# Patient Record
Sex: Male | Born: 1955
Health system: Southern US, Community
[De-identification: ages and names within clinical notes are randomized; demographics above are authoritative.]

## PROBLEM LIST (undated history)

## (undated) DIAGNOSIS — Z9289 Personal history of other medical treatment: Secondary | ICD-10-CM

## (undated) DIAGNOSIS — I10 Essential (primary) hypertension: Secondary | ICD-10-CM

## (undated) DIAGNOSIS — I480 Paroxysmal atrial fibrillation: Secondary | ICD-10-CM

## (undated) HISTORY — PX: HERNIA REPAIR: SHX51

---

## 2008-11-08 ENCOUNTER — Ambulatory Visit: Payer: Self-pay | Admitting: Family Medicine

## 2009-09-28 ENCOUNTER — Ambulatory Visit: Payer: Self-pay | Admitting: Internal Medicine

## 2010-10-28 HISTORY — PX: OTHER SURGICAL HISTORY: SHX169

## 2017-12-24 DIAGNOSIS — K4020 Bilateral inguinal hernia, without obstruction or gangrene, not specified as recurrent: Secondary | ICD-10-CM | POA: Diagnosis not present

## 2017-12-24 DIAGNOSIS — I10 Essential (primary) hypertension: Secondary | ICD-10-CM | POA: Diagnosis not present

## 2017-12-24 DIAGNOSIS — K4090 Unilateral inguinal hernia, without obstruction or gangrene, not specified as recurrent: Secondary | ICD-10-CM | POA: Diagnosis not present

## 2017-12-24 DIAGNOSIS — Z7982 Long term (current) use of aspirin: Secondary | ICD-10-CM | POA: Diagnosis not present

## 2017-12-24 DIAGNOSIS — Z79899 Other long term (current) drug therapy: Secondary | ICD-10-CM | POA: Diagnosis not present

## 2017-12-24 DIAGNOSIS — K219 Gastro-esophageal reflux disease without esophagitis: Secondary | ICD-10-CM | POA: Diagnosis not present

## 2017-12-24 DIAGNOSIS — D176 Benign lipomatous neoplasm of spermatic cord: Secondary | ICD-10-CM | POA: Diagnosis not present

## 2017-12-24 DIAGNOSIS — E876 Hypokalemia: Secondary | ICD-10-CM | POA: Diagnosis not present

## 2017-12-24 DIAGNOSIS — M19072 Primary osteoarthritis, left ankle and foot: Secondary | ICD-10-CM | POA: Diagnosis not present

## 2018-01-15 DIAGNOSIS — Z8719 Personal history of other diseases of the digestive system: Secondary | ICD-10-CM | POA: Diagnosis not present

## 2018-01-15 DIAGNOSIS — Z48815 Encounter for surgical aftercare following surgery on the digestive system: Secondary | ICD-10-CM | POA: Diagnosis not present

## 2018-01-15 DIAGNOSIS — K5900 Constipation, unspecified: Secondary | ICD-10-CM | POA: Diagnosis not present

## 2018-01-15 DIAGNOSIS — Z79899 Other long term (current) drug therapy: Secondary | ICD-10-CM | POA: Diagnosis not present

## 2018-05-21 DIAGNOSIS — L821 Other seborrheic keratosis: Secondary | ICD-10-CM | POA: Diagnosis not present

## 2018-05-21 DIAGNOSIS — D225 Melanocytic nevi of trunk: Secondary | ICD-10-CM | POA: Diagnosis not present

## 2018-05-21 DIAGNOSIS — L570 Actinic keratosis: Secondary | ICD-10-CM | POA: Diagnosis not present

## 2018-08-05 ENCOUNTER — Encounter: Payer: Self-pay | Admitting: Emergency Medicine

## 2018-08-05 ENCOUNTER — Other Ambulatory Visit: Payer: Self-pay

## 2018-08-05 ENCOUNTER — Emergency Department: Payer: BLUE CROSS/BLUE SHIELD

## 2018-08-05 ENCOUNTER — Observation Stay
Admission: EM | Admit: 2018-08-05 | Discharge: 2018-08-06 | Disposition: A | Discharge status: Home / Self Care | Payer: BLUE CROSS/BLUE SHIELD | Attending: Internal Medicine | Admitting: Internal Medicine

## 2018-08-05 ENCOUNTER — Observation Stay (HOSPITAL_BASED_OUTPATIENT_CLINIC_OR_DEPARTMENT_OTHER)
Admit: 2018-08-05 | Discharge: 2018-08-05 | Disposition: A | Discharge status: Home / Self Care | Payer: BLUE CROSS/BLUE SHIELD | Attending: Internal Medicine | Admitting: Internal Medicine

## 2018-08-05 DIAGNOSIS — Z79899 Other long term (current) drug therapy: Secondary | ICD-10-CM | POA: Insufficient documentation

## 2018-08-05 DIAGNOSIS — I480 Paroxysmal atrial fibrillation: Secondary | ICD-10-CM | POA: Diagnosis not present

## 2018-08-05 DIAGNOSIS — Z885 Allergy status to narcotic agent status: Secondary | ICD-10-CM | POA: Insufficient documentation

## 2018-08-05 DIAGNOSIS — R000 Tachycardia, unspecified: Secondary | ICD-10-CM | POA: Diagnosis not present

## 2018-08-05 DIAGNOSIS — E785 Hyperlipidemia, unspecified: Secondary | ICD-10-CM | POA: Diagnosis not present

## 2018-08-05 DIAGNOSIS — Z7982 Long term (current) use of aspirin: Secondary | ICD-10-CM | POA: Insufficient documentation

## 2018-08-05 DIAGNOSIS — R55 Syncope and collapse: Secondary | ICD-10-CM | POA: Diagnosis not present

## 2018-08-05 DIAGNOSIS — I499 Cardiac arrhythmia, unspecified: Secondary | ICD-10-CM | POA: Diagnosis present

## 2018-08-05 DIAGNOSIS — I4891 Unspecified atrial fibrillation: Secondary | ICD-10-CM | POA: Diagnosis not present

## 2018-08-05 DIAGNOSIS — R079 Chest pain, unspecified: Secondary | ICD-10-CM | POA: Diagnosis not present

## 2018-08-05 DIAGNOSIS — R9431 Abnormal electrocardiogram [ECG] [EKG]: Secondary | ICD-10-CM | POA: Diagnosis not present

## 2018-08-05 DIAGNOSIS — I10 Essential (primary) hypertension: Secondary | ICD-10-CM | POA: Insufficient documentation

## 2018-08-05 DIAGNOSIS — I4821 Permanent atrial fibrillation: Secondary | ICD-10-CM | POA: Diagnosis not present

## 2018-08-05 DIAGNOSIS — R739 Hyperglycemia, unspecified: Secondary | ICD-10-CM | POA: Insufficient documentation

## 2018-08-05 DIAGNOSIS — Z7901 Long term (current) use of anticoagulants: Secondary | ICD-10-CM | POA: Diagnosis not present

## 2018-08-05 HISTORY — DX: Personal history of other medical treatment: Z92.89

## 2018-08-05 HISTORY — DX: Paroxysmal atrial fibrillation: I48.0

## 2018-08-05 HISTORY — DX: Essential (primary) hypertension: I10

## 2018-08-05 LAB — CBC WITH DIFFERENTIAL/PLATELET
ABS IMMATURE GRANULOCYTES: 0.05 10*3/uL (ref 0.00–0.07)
BASOS ABS: 0 10*3/uL (ref 0.0–0.1)
BASOS PCT: 0 %
Eosinophils Absolute: 0.1 10*3/uL (ref 0.0–0.5)
Eosinophils Relative: 1 %
HCT: 45.3 % (ref 39.0–52.0)
HEMOGLOBIN: 15.9 g/dL (ref 13.0–17.0)
Immature Granulocytes: 1 %
LYMPHS PCT: 23 %
Lymphs Abs: 2.3 10*3/uL (ref 0.7–4.0)
MCH: 31.5 pg (ref 26.0–34.0)
MCHC: 35.1 g/dL (ref 30.0–36.0)
MCV: 89.9 fL (ref 80.0–100.0)
Monocytes Absolute: 0.9 10*3/uL (ref 0.1–1.0)
Monocytes Relative: 9 %
NEUTROS ABS: 6.6 10*3/uL (ref 1.7–7.7)
NEUTROS PCT: 66 %
NRBC: 0.2 % (ref 0.0–0.2)
PLATELETS: 240 10*3/uL (ref 150–400)
RBC: 5.04 MIL/uL (ref 4.22–5.81)
RDW: 12 % (ref 11.5–15.5)
WBC: 10 10*3/uL (ref 4.0–10.5)

## 2018-08-05 LAB — COMPREHENSIVE METABOLIC PANEL
ALBUMIN: 4.2 g/dL (ref 3.5–5.0)
ALK PHOS: 63 U/L (ref 38–126)
ALT: 17 U/L (ref 0–44)
ANION GAP: 7 (ref 5–15)
AST: 20 U/L (ref 15–41)
BUN: 16 mg/dL (ref 8–23)
CHLORIDE: 106 mmol/L (ref 98–111)
CO2: 31 mmol/L (ref 22–32)
Calcium: 8.6 mg/dL — ABNORMAL LOW (ref 8.9–10.3)
Creatinine, Ser: 1 mg/dL (ref 0.61–1.24)
GFR calc Af Amer: >60 mL/min (ref >60)
GFR calc non Af Amer: >60 mL/min (ref >60)
GLUCOSE: 120 mg/dL — AB (ref 70–99)
POTASSIUM: 3.5 mmol/L (ref 3.5–5.1)
SODIUM: 144 mmol/L (ref 135–145)
Total Bilirubin: 0.8 mg/dL (ref 0.3–1.2)
Total Protein: 6.9 g/dL (ref 6.5–8.1)

## 2018-08-05 LAB — PROTIME-INR
INR: 0.95
PROTHROMBIN TIME: 12.6 s (ref 11.4–15.2)

## 2018-08-05 LAB — MAGNESIUM
Magnesium: 2.9 mg/dL — ABNORMAL HIGH (ref 1.7–2.4)
Magnesium: 3.1 mg/dL — ABNORMAL HIGH (ref 1.7–2.4)

## 2018-08-05 LAB — LIPID PANEL
Cholesterol: 232 mg/dL — ABNORMAL HIGH (ref 0–200)
HDL: 53 mg/dL (ref >40)
LDL Cholesterol: 164 mg/dL — ABNORMAL HIGH (ref 0–99)
Total CHOL/HDL Ratio: 4.4 RATIO
Triglycerides: 75 mg/dL (ref <150)
VLDL: 15 mg/dL (ref 0–40)

## 2018-08-05 LAB — BRAIN NATRIURETIC PEPTIDE: B Natriuretic Peptide: 170 pg/mL — ABNORMAL HIGH (ref 0.0–100.0)

## 2018-08-05 LAB — HEPARIN LEVEL (UNFRACTIONATED): HEPARIN UNFRACTIONATED: 0.3 [IU]/mL (ref 0.30–0.70)

## 2018-08-05 LAB — TSH: TSH: 4.241 u[IU]/mL (ref 0.350–4.500)

## 2018-08-05 LAB — APTT: aPTT: 28 seconds (ref 24–36)

## 2018-08-05 LAB — TROPONIN I
Troponin I: <0.03 ng/mL (ref <0.03)
Troponin I: <0.03 ng/mL (ref <0.03)

## 2018-08-05 LAB — HEMOGLOBIN A1C
HEMOGLOBIN A1C: 4.7 % — AB (ref 4.8–5.6)
MEAN PLASMA GLUCOSE: 88.19 mg/dL

## 2018-08-05 MED ORDER — ONDANSETRON HCL 4 MG/2ML IJ SOLN: 4.0000 mg | Freq: Four times a day (QID) | INTRAMUSCULAR | Status: DC | PRN

## 2018-08-05 MED ORDER — DOCUSATE SODIUM 100 MG PO CAPS
100.0000 mg | ORAL_CAPSULE | Freq: Two times a day (BID) | ORAL | Status: DC
  Administered 2018-08-05 – 2018-08-06 (×2): 100 mg via ORAL
  Filled 2018-08-05 (×2): qty 1

## 2018-08-05 MED ORDER — ACETAMINOPHEN 650 MG RE SUPP: 650.0000 mg | Freq: Four times a day (QID) | RECTAL | Status: DC | PRN

## 2018-08-05 MED ORDER — ACETAMINOPHEN 325 MG PO TABS: 650.0000 mg | ORAL_TABLET | Freq: Four times a day (QID) | ORAL | Status: DC | PRN

## 2018-08-05 MED ORDER — ADULT MULTIVITAMIN W/MINERALS CH
1.0000 | ORAL_TABLET | Freq: Every day | ORAL | Status: DC
  Administered 2018-08-05 – 2018-08-06 (×2): 1 via ORAL
  Filled 2018-08-05 (×2): qty 1

## 2018-08-05 MED ORDER — HEPARIN (PORCINE) IN NACL 100-0.45 UNIT/ML-% IJ SOLN
1250.0000 [IU]/h | INTRAMUSCULAR | Status: DC
  Administered 2018-08-05: 1150 [IU]/h via INTRAVENOUS
  Filled 2018-08-05: qty 250

## 2018-08-05 MED ORDER — POTASSIUM CHLORIDE ER 20 MEQ PO TBCR: 1.0000 | EXTENDED_RELEASE_TABLET | Freq: Three times a day (TID) | ORAL | Status: DC

## 2018-08-05 MED ORDER — POTASSIUM CHLORIDE CRYS ER 20 MEQ PO TBCR
20.0000 meq | EXTENDED_RELEASE_TABLET | Freq: Two times a day (BID) | ORAL | Status: DC
  Administered 2018-08-05 – 2018-08-06 (×3): 20 meq via ORAL
  Filled 2018-08-05 (×2): qty 1

## 2018-08-05 MED ORDER — BENAZEPRIL HCL 20 MG PO TABS
20.0000 mg | ORAL_TABLET | Freq: Every day | ORAL | Status: DC
  Administered 2018-08-05 – 2018-08-06 (×2): 20 mg via ORAL
  Filled 2018-08-05 (×2): qty 1

## 2018-08-05 MED ORDER — BISOPROLOL-HYDROCHLOROTHIAZIDE 5-6.25 MG PO TABS
1.0000 | ORAL_TABLET | Freq: Every day | ORAL | Status: DC
  Administered 2018-08-05 – 2018-08-06 (×2): 1 via ORAL
  Filled 2018-08-05 (×2): qty 1

## 2018-08-05 MED ORDER — ASPIRIN 81 MG PO CHEW
81.0000 mg | CHEWABLE_TABLET | Freq: Every day | ORAL | Status: DC
  Administered 2018-08-06: 81 mg via ORAL
  Filled 2018-08-05: qty 1

## 2018-08-05 MED ORDER — ENOXAPARIN SODIUM 40 MG/0.4ML ~~LOC~~ SOLN: 40.0000 mg | SUBCUTANEOUS | Status: DC

## 2018-08-05 MED ORDER — VITAMIN C 500 MG PO TABS
1000.0000 mg | ORAL_TABLET | Freq: Every day | ORAL | Status: DC
  Administered 2018-08-05 – 2018-08-06 (×2): 1000 mg via ORAL
  Filled 2018-08-05 (×2): qty 2

## 2018-08-05 MED ORDER — ONDANSETRON HCL 4 MG PO TABS: 4.0000 mg | ORAL_TABLET | Freq: Four times a day (QID) | ORAL | Status: DC | PRN

## 2018-08-05 MED ORDER — FAMOTIDINE 20 MG PO TABS
20.0000 mg | ORAL_TABLET | Freq: Two times a day (BID) | ORAL | Status: DC
  Administered 2018-08-05 – 2018-08-06 (×3): 20 mg via ORAL
  Filled 2018-08-05 (×3): qty 1

## 2018-08-05 MED ORDER — AMLODIPINE BESY-BENAZEPRIL HCL 5-20 MG PO CAPS: 1.0000 | ORAL_CAPSULE | Freq: Two times a day (BID) | ORAL | Status: DC

## 2018-08-05 MED ORDER — MAGNESIUM SULFATE 4 GM/100ML IV SOLN
4.0000 g | Freq: Once | INTRAVENOUS | Status: AC
  Administered 2018-08-05: 4 g via INTRAVENOUS
  Filled 2018-08-05: qty 100

## 2018-08-05 MED ORDER — TURMERIC 500 MG PO CAPS: 1.0000 | ORAL_CAPSULE | Freq: Every day | ORAL | Status: DC

## 2018-08-05 MED ORDER — HEPARIN BOLUS VIA INFUSION
4000.0000 [IU] | Freq: Once | INTRAVENOUS | Status: AC
  Administered 2018-08-05: 4000 [IU] via INTRAVENOUS
  Filled 2018-08-05: qty 4000

## 2018-08-05 MED ORDER — ASPIRIN 81 MG PO CHEW
324.0000 mg | CHEWABLE_TABLET | Freq: Once | ORAL | Status: AC
  Administered 2018-08-05: 324 mg via ORAL
  Filled 2018-08-05: qty 4

## 2018-08-05 MED ORDER — AMLODIPINE BESYLATE 5 MG PO TABS
5.0000 mg | ORAL_TABLET | Freq: Every day | ORAL | Status: DC
  Administered 2018-08-05: 5 mg via ORAL
  Filled 2018-08-05: qty 1

## 2018-08-05 NOTE — H&P (Signed)
Jerry Delgado is an 62 y.o. male.   Chief Complaint: Fainting HPI: The patient with past medical history of high blood pressure presents to the emergency department after an episode of syncope on the commode.  The patient denies chest pain or shortness of breath.  He also denies palpitations, nausea, vomiting or diaphoresis.  In the emergency department he was found to have new onset atrial fibrillation.  Despite his rate control the patient had some ST depressions in lead III and aVF which prompted the emergency department staff to call the hospitalist for further evaluation.  Past Medical History:  Diagnosis Date  . Hypertension     Past Surgical History:  Procedure Laterality Date  . HERNIA REPAIR    . shattered heel  2012    History reviewed. No pertinent family history. Social History:  reports that he has never smoked. He has never used smokeless tobacco. He reports that he does not drink alcohol or use drugs.  Allergies:  Allergies  Allergen Reactions  . Codeine Nausea And Vomiting     (Not in a hospital admission)  Results for orders placed or performed during the hospital encounter of 08/05/18 (from the past 48 hour(s))  Comprehensive metabolic panel     Status: Abnormal   Collection Time: 08/05/18  5:13 AM  Result Value Ref Range   Sodium 144 135 - 145 mmol/L   Potassium 3.5 3.5 - 5.1 mmol/L   Chloride 106 98 - 111 mmol/L   CO2 31 22 - 32 mmol/L   Glucose, Bld 120 (H) 70 - 99 mg/dL   BUN 16 8 - 23 mg/dL   Creatinine, Ser 1.00 0.61 - 1.24 mg/dL   Calcium 8.6 (L) 8.9 - 10.3 mg/dL   Total Protein 6.9 6.5 - 8.1 g/dL   Albumin 4.2 3.5 - 5.0 g/dL   AST 20 15 - 41 U/L   ALT 17 0 - 44 U/L   Alkaline Phosphatase 63 38 - 126 U/L   Total Bilirubin 0.8 0.3 - 1.2 mg/dL   GFR calc non Af Amer >60 >60 mL/min   GFR calc Af Amer >60 >60 mL/min    Comment: (NOTE) The eGFR has been calculated using the CKD EPI equation. This calculation has not been validated in all clinical  situations. eGFR's persistently <60 mL/min signify possible Chronic Kidney Disease.    Anion gap 7 5 - 15    Comment: Performed at [facility], [address] [place], [place] [address]  Troponin I     Status: None   Collection Time: 08/05/18  5:13 AM  Result Value Ref Range   Troponin I <0.03 <0.03 ng/mL    Comment: Performed at [facility], [address] [place], [place] [address]  Brain natriuretic peptide     Status: Abnormal   Collection Time: 08/05/18  5:13 AM  Result Value Ref Range   B Natriuretic Peptide 170.0 (H) 0.0 - 100.0 pg/mL    Comment: Performed at [facility], [address] [place], [place] [address]  CBC with Differential     Status: None   Collection Time: 08/05/18  5:13 AM  Result Value Ref Range   WBC 10.0 4.0 - 10.5 K/uL   RBC 5.04 4.22 - 5.81 MIL/uL   Hemoglobin 15.9 13.0 - 17.0 g/dL   HCT 45.3 39.0 - 52.0 %   MCV 89.9 80.0 - 100.0 fL   MCH 31.5 26.0 - 34.0 pg   MCHC 35.1 30.0 -  36.0 g/dL   RDW 12.0 11.5 - 15.5 %   Platelets 240 150 - 400 K/uL   nRBC 0.2 0.0 - 0.2 %   Neutrophils Relative % 66 %   Neutro Abs 6.6 1.7 - 7.7 K/uL   Lymphocytes Relative 23 %   Lymphs Abs 2.3 0.7 - 4.0 K/uL   Monocytes Relative 9 %   Monocytes Absolute 0.9 0.1 - 1.0 K/uL   Eosinophils Relative 1 %   Eosinophils Absolute 0.1 0.0 - 0.5 K/uL   Basophils Relative 0 %   Basophils Absolute 0.0 0.0 - 0.1 K/uL   Immature Granulocytes 1 %   Abs Immature Granulocytes 0.05 0.00 - 0.07 K/uL    Comment: Performed at [facility], [address] [place], [place] [address]   Dg Chest Port 1 View  Result Date: 08/05/2018 CLINICAL DATA:  Chest pain and syncope EXAM: PORTABLE CHEST 1 VIEW COMPARISON:  None. FINDINGS: The heart size and mediastinal contours are within normal limits. Both lungs are clear. The visualized skeletal structures are unremarkable. IMPRESSION: No active disease. Electronically Signed   By: Ulyses Jarred M.D.   On:  08/05/2018 05:21    Review of Systems  Constitutional: Negative for chills and fever.  HENT: Negative for sore throat and tinnitus.   Eyes: Negative for blurred vision and redness.  Respiratory: Negative for cough and shortness of breath.   Cardiovascular: Negative for chest pain, palpitations, orthopnea and PND.  Gastrointestinal: Negative for abdominal pain, diarrhea, nausea and vomiting.  Genitourinary: Negative for dysuria, frequency and urgency.  Musculoskeletal: Negative for joint pain and myalgias.  Skin: Negative for rash.       No lesions  Neurological: Positive for loss of consciousness. Negative for speech change, focal weakness and weakness.  Endo/Heme/Allergies: Does not bruise/bleed easily.       No temperature intolerance  Psychiatric/Behavioral: Negative for depression and suicidal ideas.    Blood pressure 126/84, pulse 73, temperature 97.7 F (36.5 C), temperature source Oral, resp. rate (!) 23, height '5\' 9"'$  (1.753 m), weight 80.7 kg, SpO2 99 %. Physical Exam  Vitals reviewed. Constitutional: He is oriented to person, place, and time. He appears well-developed and well-nourished. No distress.  HENT:  Head: Normocephalic and atraumatic.  Mouth/Throat: Oropharynx is clear and moist.  Eyes: Pupils are equal, round, and reactive to light. Conjunctivae and EOM are normal. No scleral icterus.  Neck: Normal range of motion. Neck supple. No JVD present. No tracheal deviation present. No thyromegaly present.  Cardiovascular: Normal rate, regular rhythm and normal heart sounds. Exam reveals no gallop and no friction rub.  No murmur heard. Respiratory: Effort normal and breath sounds normal. No respiratory distress.  GI: Soft. Bowel sounds are normal. He exhibits no distension. There is no tenderness.  Genitourinary:  Genitourinary Comments: Deferred  Musculoskeletal: Normal range of motion. He exhibits no edema.  Lymphadenopathy:    He has no cervical adenopathy.   Neurological: He is alert and oriented to person, place, and time. No cranial nerve deficit.  Skin: Skin is warm and dry. No rash noted. No erythema.  Psychiatric: He has a normal mood and affect. His behavior is normal. Judgment and thought content normal.     Assessment/Plan This is a 62 year old male admitted for arrhythmia. 1.  Atrial fibrillation: New onset; chads score 0.  Rate controlled.  Bone and is negative and the patient is chest pain-free.  Nonetheless we will ask cardiology to evaluate the patient.  Continue aspirin. 2.  Syncope: Etiology appears to be vasovagal.  Obtain echocardiogram. 3.  Hypertension: Controlled; continue bisoprolol with hydrochlorothiazide as well as amlodipine with benazepril 4.  DVT prophylaxis: Lovenox 5.  GI prophylaxis: H2 blocker per home regimen The patient is a full code.  Time spent on admission orders and patient care approximately 45 minutes  Harrie Foreman, MD 08/05/2018, 6:04 AM

## 2018-08-05 NOTE — Progress Notes (Signed)
*  PRELIMINARY RESULTS* Echocardiogram 2D Echocardiogram has been performed.  Joanette Gula Madalynn Pickelsimer 08/05/2018, 3:42 PM

## 2018-08-05 NOTE — ED Provider Notes (Signed)
[facility] Emergency Department Provider Note  ____________________________________________   First MD Initiated Contact with Patient 08/05/18 0503     (approximate)  I have reviewed the triage vital signs and the nursing notes.   HISTORY  Chief Complaint No chief complaint on file.    HPI Jerry Delgado is a 62 y.o. male who comes to the emergency department via EMS after a syncopal event this evening.  He got up to use the bathroom this morning and when he sat down to defecate he felt lightheaded, palpitations, became diaphoretic, and lurched forward and fell to the ground.  His wife came into the bathroom and found him semi-conscious.  He has a past medical history of hypertension but no history of arrhythmia.  He has never passed out before.  He has no chest pain or shortness of breath.  He did not hit his head.  He was not incontinent to urine or feces.  He feels "normal".  Symptoms came on suddenly and were severe.   Past Medical History:  Diagnosis Date  . Hypertension     Patient Active Problem List   Diagnosis Date Noted  . Arrhythmia 08/05/2018    Past Surgical History:  Procedure Laterality Date  . HERNIA REPAIR    . shattered heel  2012    Prior to Admission medications   Medication Sig Start Date End Date Taking? Authorizing Provider  amLODipine-benazepril (LOTREL) 5-20 MG capsule Take 1 capsule by mouth 2 (two) times daily. 07/11/18  Yes [provider]  aspirin 81 MG chewable tablet Chew 81 mg by mouth daily.   Yes [provider]  bisoprolol-hydrochlorothiazide (ZIAC) 5-6.25 MG tablet Take 1 tablet by mouth daily. 07/11/18  Yes [provider]  Multiple Vitamin (MULTIVITAMIN WITH MINERALS) TABS tablet Take 1 tablet by mouth daily.   Yes [provider]  Potassium Chloride ER 20 MEQ TBCR Take 1 tablet by mouth 3 (three) times daily. 07/11/18  Yes [provider]  ranitidine (ZANTAC) 150 MG  tablet Take 150 mg by mouth 2 (two) times daily. 07/11/18  Yes [provider]  Turmeric 500 MG CAPS Take 1 capsule by mouth daily.   Yes [provider]  vitamin C (ASCORBIC ACID) 500 MG tablet Take 1,000 mg by mouth daily.    Yes [provider]    Allergies Codeine  History reviewed. No pertinent family history.  Social History Social History   Tobacco Use  . Smoking status: Never Smoker  . Smokeless tobacco: Never Used  Substance Use Topics  . Alcohol use: Never    Frequency: Never  . Drug use: Never    Review of Systems Constitutional: No fever/chills Eyes: No visual changes. ENT: No sore throat. Cardiovascular: Denies chest pain. Respiratory: Denies shortness of breath. Gastrointestinal: No abdominal pain.  Positive for nausea, no vomiting.  No diarrhea.  No constipation. Genitourinary: Negative for dysuria. Musculoskeletal: Negative for back pain. Skin: Negative for rash. Neurological: Negative for headaches, focal weakness or numbness.   ____________________________________________   PHYSICAL EXAM:  VITAL SIGNS: ED Triage Vitals  Enc Vitals Group     BP      Pulse      Resp      Temp      Temp src      SpO2      Weight      Height      Head Circumference      Peak Flow  Pain Score      Pain Loc      Pain Edu?      Excl. in GC?     Constitutional: Alert and oriented x4 pleasant cooperative speaks in full clear sentences no diaphoresis Eyes: PERRL EOMI. Head: Atraumatic. Nose: No congestion/rhinnorhea. Mouth/Throat: No trismus no tongue fasciculations.  No bites to his tongue Neck: No stridor.   Cardiovascular: Irregularly irregular grossly normal heart sounds.  Good peripheral circulation. Respiratory: Normal respiratory effort.  No retractions. Lungs CTAB and moving good air Gastrointestinal: Soft nontender Musculoskeletal: No lower extremity edema   Neurologic:  Normal speech and language. No gross focal  neurologic deficits are appreciated. Skin:  Skin is warm, dry and intact. No rash noted. Psychiatric: Mood and affect are normal. Speech and behavior are normal.    ____________________________________________   DIFFERENTIAL includes but not limited to  Neurogenic syncope, vasovagal syncope, dehydration ____________________________________________   LABS (all labs ordered are listed, but only abnormal results are displayed)  Labs Reviewed  COMPREHENSIVE METABOLIC PANEL - Abnormal; Notable for the following components:      Result Value   Glucose, Bld 120 (*)    Calcium 8.6 (*)    All other components within normal limits  BRAIN NATRIURETIC PEPTIDE - Abnormal; Notable for the following components:   B Natriuretic Peptide 170.0 (*)    All other components within normal limits  TROPONIN I  CBC WITH DIFFERENTIAL/PLATELET    Lab work reviewed by me with slightly increased BNP __________________________________________  EKG  ED ECG REPORT I, Merrily Brittle, the attending physician, personally viewed and interpreted this ECG.  Date: 08/05/2018 EKG Time:  Rate: 73 Rhythm: Atrial fibrillation QRS Axis: Leftward axis Intervals: normal ST/T Wave abnormalities: T wave inversion lead III and aVF with slight ST depression and no reciprocal elevation Narrative Interpretation: no evidence of acute ischemia  ____________________________________________  RADIOLOGY  Chest x-ray reviewed by me with no acute disease ____________________________________________   PROCEDURES  Procedure(s) performed: no  Procedures  Critical Care performed: no  ____________________________________________   INITIAL IMPRESSION / ASSESSMENT AND PLAN / ED COURSE  Pertinent labs & imaging results that were available during my care of the patient were reviewed by me and considered in my medical decision making (see chart for details).   As part of my medical decision making, I reviewed the  following data within the electronic MEDICAL RECORD NUMBER History obtained from family if available, nursing notes, old chart and ekg, as well as notes from prior ED visits.  The patient comes to the emergency department in new onset atrial fibrillation after having a syncopal event.  He remains in atrial fibrillation on the monitor.  He does have T wave inversion inferiorly along with slight depression although he has no chest pain or shortness of breath.  Repeat EKG is unchanged.  No previous EKGs to compare.  Given his syncopal event with new onset arrhythmia I will give him a full dose aspirin and he will require inpatient admission for telemetry and cardiology evaluation.  I discussed the hospitalist who has graciously agreed to admit the patient to his service.   FINAL CLINICAL IMPRESSION(S) / ED DIAGNOSES  Final diagnoses:  Syncope, cardiogenic  Atrial fibrillation, unspecified type (HCC)      NEW MEDICATIONS STARTED DURING THIS VISIT:  New Prescriptions   No medications on file     Note:  This document was prepared using Dragon voice recognition software and may include unintentional dictation errors.  Merrily Brittle, MD 08/05/18 [phone]

## 2018-08-05 NOTE — Progress Notes (Signed)
ANTICOAGULATION CONSULT NOTE - Initial Consult  Pharmacy Consult for Heparin Indication: atrial fibrillation  Allergies  Allergen Reactions  . Codeine Nausea And Vomiting    Patient Measurements: Height: 5\' 8"  (172.7 cm) Weight: 175 lb 11.2 oz (79.7 kg) IBW/kg (Calculated) : 68.4 Heparin Dosing Weight: 79.7 kg  Vital Signs: Temp: 98 F (36.7 C) (10/09 1200) Temp Source: Oral (10/09 1200) BP: 123/81 (10/09 1200) Pulse Rate: 89 (10/09 1200)  Labs: Recent Labs    08/05/18 0513 08/05/18 1324  HGB 15.9  --   HCT 45.3  --   PLT 240  --   CREATININE 1.00  --   TROPONINI <0.03 <0.03    Estimated Creatinine Clearance: 74.1 mL/min (by C-G formula based on SCr of 1 mg/dL).   Medical History: Past Medical History:  Diagnosis Date  . Hypertension      Assessment: 62 yo male with new onset AFib to start on heparin drip per cardiology.   Goal of Therapy:  Heparin level 0.3-0.7 units/ml Monitor platelets by anticoagulation protocol: Yes   Plan:  D/c ppx enoxaparin (no dose given yet) Add-on aPTT and INR; per lab they have blood sample Heparin 4000 units IV x1 then 1150 units/hr (=11.5 ml/hr) Heparin level 6h after start of heparin infusion CBC in AM  Pharmacy will continue to follow   Crist Fat L 08/05/2018,2:32 PM

## 2018-08-05 NOTE — Consult Note (Signed)
Cardiology Consultation:   Patient ID: Jerry Delgado; [id number]; April 08, 1956   Admit date: 08/05/2018 Date of Consult: 08/05/2018  Primary Care Provider: Dione Housekeeper, MD Primary Cardiologist: New to [facility] - consult by Gollan   Patient Profile:   Jerry Delgado is a 62 y.o. male with a hx of HTN and palpitations who is being seen today for the evaluation of new onset Afib and abnormal EKG at the request of Dr. Sheryle Hail.  History of Present Illness:   Jerry Delgado has no previously known cardiac history.  He has noted intermittent tachypalpitations for the past several months.  Patient was in his usual state of health on the evening of 10/8 when he got up to void.  After voiding, the patient felt like he had to have a bowel movement and went to turn and sit on the toilet.  Upon sitting on the toilet he became flushed, diaphoretic, and nauseated.  He is uncertain if he actually had a bowel movement or not.  He got up and attempted to leave the bathroom to go lay down and suffered a syncopal episode.  His wife was awoken at this point secondary to the patient hitting the floor and rattling the scale by the bathtub.  Patient denied having any chest pain, dyspnea, or palpitations leading up to these events.  Upon his wife arriving in the bathroom he was minimally responsive.  She called EMS.  By the time she got off the phone with EMS patient was lucid and was able to climb back into his bed.  Since then, the patient has been asymptomatic.  Upon the patient's arrival to [facility] they were found to have stable vitals. EKG showed Afib, 73 bpm, inferolateral TWI. Follow up EKG showed Afib, 79 bpm, inferolateral TWI, CXR showed no active disease. Labs showed troponin negative x 1, K+ 3.5, glucose 120, SCr 1.0, LFT normal, CBC unremarkable, BNP 170. In the ED, he was given ASA 324 mg x 1 and IV magnesium. Currently, doing well. He will note brief episodes of tachy-palpitations at rest. No further  syncope.    Past Medical History:  Diagnosis Date  . Hypertension     Past Surgical History:  Procedure Laterality Date  . HERNIA REPAIR    . shattered heel  2012     Home Meds: Prior to Admission medications   Medication Sig Start Date End Date Taking? Authorizing Provider  amLODipine-benazepril (LOTREL) 5-20 MG capsule Take 1 capsule by mouth 2 (two) times daily. 07/11/18  Yes [provider]  aspirin 81 MG chewable tablet Chew 81 mg by mouth daily.   Yes [provider]  bisoprolol-hydrochlorothiazide (ZIAC) 5-6.25 MG tablet Take 1 tablet by mouth daily. 07/11/18  Yes [provider]  Multiple Vitamin (MULTIVITAMIN WITH MINERALS) TABS tablet Take 1 tablet by mouth daily.   Yes [provider]  Potassium Chloride ER 20 MEQ TBCR Take 1 tablet by mouth 3 (three) times daily. 07/11/18  Yes [provider]  ranitidine (ZANTAC) 150 MG tablet Take 150 mg by mouth 2 (two) times daily. 07/11/18  Yes [provider]  Turmeric 500 MG CAPS Take 1 capsule by mouth daily.   Yes [provider]  vitamin C (ASCORBIC ACID) 500 MG tablet Take 1,000 mg by mouth daily.    Yes [provider]    Inpatient Medications: Scheduled Meds: . amLODipine  5 mg Oral Daily   And  . benazepril  20 mg Oral  Daily  . [START ON 08/06/2018] aspirin  81 mg Oral Daily  . bisoprolol-hydrochlorothiazide  1 tablet Oral Daily  . docusate sodium  100 mg Oral BID  . enoxaparin (LOVENOX) injection  40 mg Subcutaneous Q24H  . famotidine  20 mg Oral BID  . multivitamin with minerals  1 tablet Oral Daily  . potassium chloride  20 mEq Oral BID  . vitamin C  1,000 mg Oral Daily   Continuous Infusions:  PRN Meds: acetaminophen **OR** acetaminophen, ondansetron **OR** ondansetron (ZOFRAN) IV  Allergies:   Allergies  Allergen Reactions  . Codeine Nausea And Vomiting    Social History:   Social History   Socioeconomic History  . Marital status:  Married    Spouse name: Not on file  . Number of children: Not on file  . Years of education: Not on file  . Highest education level: Not on file  Occupational History  . Not on file  Social Needs  . Financial resource strain: Not on file  . Food insecurity:    Worry: Not on file    Inability: Not on file  . Transportation needs:    Medical: Not on file    Non-medical: Not on file  Tobacco Use  . Smoking status: Never Smoker  . Smokeless tobacco: Never Used  Substance and Sexual Activity  . Alcohol use: Never    Frequency: Never  . Drug use: Never  . Sexual activity: Not on file  Lifestyle  . Physical activity:    Days per week: Not on file    Minutes per session: Not on file  . Stress: Not on file  Relationships  . Social connections:    Talks on phone: Not on file    Gets together: Not on file    Attends religious service: Not on file    Active member of club or organization: Not on file    Attends meetings of clubs or organizations: Not on file    Relationship status: Not on file  . Intimate partner violence:    Fear of current or ex partner: Not on file    Emotionally abused: Not on file    Physically abused: Not on file    Forced sexual activity: Not on file  Other Topics Concern  . Not on file  Social History Narrative  . Not on file     Family History:   Family History  Problem Relation Age of Onset  . Hypertension Father     ROS:  Review of Systems  Constitutional: Positive for diaphoresis and malaise/fatigue. Negative for chills, fever and weight loss.  HENT: Negative for congestion.   Eyes: Negative for discharge and redness.  Respiratory: Negative for cough, hemoptysis, sputum production, shortness of breath and wheezing.   Cardiovascular: Positive for palpitations. Negative for chest pain, orthopnea, claudication, leg swelling and PND.  Gastrointestinal: Positive for nausea. Negative for abdominal pain, blood in stool, constipation, diarrhea,  heartburn, melena and vomiting.  Genitourinary: Negative for hematuria.  Musculoskeletal: Negative for falls and myalgias.  Skin: Negative for rash.  Neurological: Positive for dizziness, loss of consciousness and weakness. Negative for tingling, tremors, sensory change, speech change and focal weakness.  Endo/Heme/Allergies: Does not bruise/bleed easily.  Psychiatric/Behavioral: Negative for substance abuse. The patient is not nervous/anxious.   All other systems reviewed and are negative.     Physical Exam/Data:   Vitals:   08/05/18 1045 08/05/18 1115 08/05/18 1130 08/05/18 1200  BP:   116/79 123/81  Pulse: 87  82 89  Resp: 18 15  20   Temp:    98 F (36.7 C)  TempSrc:    Oral  SpO2: 95%  95% 99%  Weight:    79.7 kg  Height:    5\' 8"  (1.727 m)    Intake/Output Summary (Last 24 hours) at 08/05/2018 1345 Last data filed at 08/05/2018 0734 Gross per 24 hour  Intake 93.06 ml  Output -  Net 93.06 ml   Filed Weights   08/05/18 0510 08/05/18 1200  Weight: 80.7 kg 79.7 kg   Body mass index is 26.72 kg/m.   Physical Exam: General: Well developed, well nourished, in no acute distress. Head: Normocephalic, atraumatic, sclera non-icteric, no xanthomas, nares without discharge.  Neck: Negative for carotid bruits. JVD not elevated. Lungs: Clear bilaterally to auscultation without wheezes, rales, or rhonchi. Breathing is unlabored. Heart: Irregularly irregular with S1 S2. No murmurs, rubs, or gallops appreciated. Abdomen: Soft, non-tender, non-distended with normoactive bowel sounds. No hepatomegaly. No rebound/guarding. No obvious abdominal masses. Msk:  Strength and tone appear normal for age. Extremities: No clubbing or cyanosis. No edema. Distal pedal pulses are 2+ and equal bilaterally. Neuro: Alert and oriented X 3. No facial asymmetry. No focal deficit. Moves all extremities spontaneously. Psych:  Responds to questions appropriately with a normal affect.   EKG:  The EKG was  personally reviewed and demonstrates: Afib, 73 bpm, inferolateral TWI. Follow up EKG showed Afib, 79 bpm, inferolateral TWI Telemetry:  Telemetry was personally reviewed and demonstrates: Afib, 80s to 120s bpm  Weights: Filed Weights   08/05/18 0510 08/05/18 1200  Weight: 80.7 kg 79.7 kg    Relevant CV Studies: Echo pending  Laboratory Data:  Chemistry Recent Labs  Lab 08/05/18 0513  NA 144  K 3.5  CL 106  CO2 31  GLUCOSE 120*  BUN 16  CREATININE 1.00  CALCIUM 8.6*  GFRNONAA >60  GFRAA >60  ANIONGAP 7    Recent Labs  Lab 08/05/18 0513  PROT 6.9  ALBUMIN 4.2  AST 20  ALT 17  ALKPHOS 63  BILITOT 0.8   Hematology Recent Labs  Lab 08/05/18 0513  WBC 10.0  RBC 5.04  HGB 15.9  HCT 45.3  MCV 89.9  MCH 31.5  MCHC 35.1  RDW 12.0  PLT 240   Cardiac Enzymes Recent Labs  Lab 08/05/18 0513  TROPONINI <0.03   No results for input(s): TROPIPOC in the last 168 hours.  BNP Recent Labs  Lab 08/05/18 0513  BNP 170.0*    DDimer No results for input(s): DDIMER in the last 168 hours.  Radiology/Studies:  Dg Chest Port 1 View  Result Date: 08/05/2018 IMPRESSION: No active disease. Electronically Signed   By: Deatra Robinson M.D.   On: 08/05/2018 05:21    Assessment and Plan:   1. New onset Afib: -Remains in Aifb with ventricular rates in the 70s to 80s bpm and at times will have brief episodes into the 120s bpm -Replete potassium to goal 4.0 -Check magnesium and TSH -CHADS2VASc at least 1 (HTN) -Continue bisoprolol for rate control -Uncertain how long he has been in Afib as he notes intermittent episodes of tachy-palpitations over the past several months -Start heparin gtt with plans to transition to DOAC once it is clear he will not require any invasive procedures -Once he has started on a DOAC, would stop ASA -If he does not spontaneously convert he will need DCCV as an outpatient after he has been adequately anticoagulated  without interruption for 3-4  weeks. If his ventricular rates become difficult to control prior to discharge, he would need a TEE/DCCV prior to going home -Ambulate to assess rate control -If rates become tachycardic with ambulation or persistently tachycardic at rest, would change amlodipine to diltiazem  -Ischemic evaluation as below -Consider evaluation for PE given Afib and syncope, defer to IM  2. Abnormal EKG: -Baseline unknown  -Troponin negative x 1, continue to cycle to rule out -Echo pending -Recommend proceeding with nuclear stress test on 10/10 (not NPO currently and needs to rule out) to evaluate for high risk ischemia given abnormal EKG and syncope -If he rules in, would plan for LHC on 10/10  -NPO at midnight   3. Syncope: -Possibly in the setting of BM, though timing and history are somewhat concerning  -Echo pending -Monitor on telemetry -Consider carotid artery ultrasound and PE evaluation, defer to IM  -Ischemic evaluation as above  4. HTN: -Blood pressure well controlled -Continue amlodipine, benazepril, bisoprolol, and HCTZ  5. Hyperglycemia: -Check A1c -Check lipid panel for further risk stratification    For questions or updates, please contact [facility] Please consult [email or web address] for contact info under Cardiology/STEMI.   Signed, Eula Listen, PA-C [facility] Pager: [phone] 08/05/2018, 1:45 PM

## 2018-08-05 NOTE — ED Triage Notes (Signed)
Pt arrived from home via EMS with complaints of a syncopal episode about 30 minutes ago. Pt wife told EMS that pt got up from bed and went to use the bathroom, pt began to feel dizzy and when he got up from using the toilet he must have passed out. Wife found him laying in bathroom floor up against the tub. Wife states that she believes he was passed out for about 5 minutes. Pt states that he feels very nauseous and was sweaty. EMS stated that the 12-Lead showed A-fib. Pt has no cardiac Hx. Pt does have Hx of HTN. Pt denies pain. Pt is alert and oriented x 4. EMS placed a 20 gauge in left AC. EMS gave 4 mg of zofran. Pt states that he remembers that last night before bed he had a bad headache. VS WNL per EMS.

## 2018-08-05 NOTE — Progress Notes (Signed)
ANTICOAGULATION CONSULT NOTE - Initial Consult  Pharmacy Consult for Heparin Indication: atrial fibrillation  Allergies  Allergen Reactions  . Codeine Nausea And Vomiting    Patient Measurements: Height: 5\' 8"  (172.7 cm) Weight: 175 lb 11.2 oz (79.7 kg) IBW/kg (Calculated) : 68.4 Heparin Dosing Weight: 79.7 kg  Vital Signs: Temp: 98.6 F (37 C) (10/09 1912) Temp Source: Oral (10/09 1912) BP: 124/88 (10/09 1912) Pulse Rate: 87 (10/09 1912)  Labs: Recent Labs    08/05/18 0513 08/05/18 1324 08/05/18 1325 08/05/18 2123  HGB 15.9  --   --   --   HCT 45.3  --   --   --   PLT 240  --   --   --   APTT  --   --  28  --   LABPROT  --   --  12.6  --   INR  --   --  0.95  --   HEPARINUNFRC  --   --   --  0.30  CREATININE 1.00  --   --   --   TROPONINI <0.03 <0.03  --   --     Estimated Creatinine Clearance: 74.1 mL/min (by C-G formula based on SCr of 1 mg/dL).   Medical History: Past Medical History:  Diagnosis Date  . Hypertension      Assessment: 62 yo male with new onset AFib to start on heparin drip per cardiology.   Goal of Therapy:  Heparin level 0.3-0.7 units/ml Monitor platelets by anticoagulation protocol: Yes   Plan:  D/c ppx enoxaparin (no dose given yet) Add-on aPTT and INR; per lab they have blood sample Heparin 4000 units IV x1 then 1150 units/hr (=11.5 ml/hr) Heparin level 6h after start of heparin infusion CBC in AM  10/9:  HL @ 21:30 = 0.3 Will continue this pt on current rate and recheck HL on 10/10 @ 0300.   Pharmacy will continue to follow   Jerry Delgado D 08/05/2018,10:01 PM

## 2018-08-06 ENCOUNTER — Observation Stay (HOSPITAL_BASED_OUTPATIENT_CLINIC_OR_DEPARTMENT_OTHER): Payer: BLUE CROSS/BLUE SHIELD

## 2018-08-06 ENCOUNTER — Encounter: Payer: Self-pay | Admitting: Physician Assistant

## 2018-08-06 ENCOUNTER — Telehealth: Payer: Self-pay | Admitting: Physician Assistant

## 2018-08-06 DIAGNOSIS — R55 Syncope and collapse: Secondary | ICD-10-CM | POA: Diagnosis not present

## 2018-08-06 DIAGNOSIS — I10 Essential (primary) hypertension: Secondary | ICD-10-CM | POA: Diagnosis not present

## 2018-08-06 DIAGNOSIS — I4891 Unspecified atrial fibrillation: Secondary | ICD-10-CM | POA: Diagnosis not present

## 2018-08-06 LAB — NM MYOCAR MULTI W/SPECT W/WALL MOTION / EF
CSEPHR: 105 %
CSEPPHR: 166 {beats}/min
LV dias vol: 38 mL (ref 62–150)
LVSYSVOL: 12 mL
Rest HR: 129 {beats}/min
SDS: 0
SRS: 0
SSS: 0
TID: 0.76

## 2018-08-06 LAB — CBC
HEMATOCRIT: 44.3 % (ref 39.0–52.0)
Hemoglobin: 15.3 g/dL (ref 13.0–17.0)
MCH: 30.7 pg (ref 26.0–34.0)
MCHC: 34.5 g/dL (ref 30.0–36.0)
MCV: 89 fL (ref 80.0–100.0)
Platelets: 228 10*3/uL (ref 150–400)
RBC: 4.98 MIL/uL (ref 4.22–5.81)
RDW: 11.9 % (ref 11.5–15.5)
WBC: 9.7 10*3/uL (ref 4.0–10.5)
nRBC: 0 % (ref 0.0–0.2)

## 2018-08-06 LAB — ECHOCARDIOGRAM COMPLETE
Height: 68 in
Weight: 2811.2 oz

## 2018-08-06 LAB — HEPARIN LEVEL (UNFRACTIONATED)
Heparin Unfractionated: 0.24 IU/mL — ABNORMAL LOW (ref 0.30–0.70)
Heparin Unfractionated: 0.22 IU/mL — ABNORMAL LOW (ref 0.30–0.70)

## 2018-08-06 MED ORDER — DILTIAZEM HCL ER COATED BEADS 120 MG PO CP24
120.0000 mg | ORAL_CAPSULE | Freq: Every day | ORAL | Status: DC
  Administered 2018-08-06: 120 mg via ORAL
  Filled 2018-08-06: qty 1

## 2018-08-06 MED ORDER — APIXABAN 5 MG PO TABS: 5.0000 mg | ORAL_TABLET | Freq: Two times a day (BID) | ORAL | 0 refills | Status: DC

## 2018-08-06 MED ORDER — DILTIAZEM HCL ER COATED BEADS 120 MG PO CP24: 120.0000 mg | ORAL_CAPSULE | Freq: Every day | ORAL | 0 refills | Status: DC

## 2018-08-06 MED ORDER — APIXABAN 5 MG PO TABS
5.0000 mg | ORAL_TABLET | Freq: Two times a day (BID) | ORAL | Status: DC
  Administered 2018-08-06: 5 mg via ORAL
  Filled 2018-08-06: qty 1

## 2018-08-06 MED ORDER — TECHNETIUM TC 99M TETROFOSMIN IV KIT
32.3260 | PACK | Freq: Once | INTRAVENOUS | Status: AC | PRN
  Administered 2018-08-06: 32.326 via INTRAVENOUS

## 2018-08-06 MED ORDER — REGADENOSON 0.4 MG/5ML IV SOLN
0.4000 mg | Freq: Once | INTRAVENOUS | Status: AC
  Administered 2018-08-06: 0.4 mg via INTRAVENOUS

## 2018-08-06 MED ORDER — TECHNETIUM TC 99M TETROFOSMIN IV KIT
11.0300 | PACK | Freq: Once | INTRAVENOUS | Status: AC | PRN
  Administered 2018-08-06: 11.03 via INTRAVENOUS

## 2018-08-06 MED ORDER — BENAZEPRIL HCL 20 MG PO TABS: 20.0000 mg | ORAL_TABLET | Freq: Every day | ORAL | 0 refills | Status: DC

## 2018-08-06 MED ORDER — HEPARIN BOLUS VIA INFUSION
1200.0000 [IU] | Freq: Once | INTRAVENOUS | Status: AC
  Administered 2018-08-06: 1200 [IU] via INTRAVENOUS
  Filled 2018-08-06: qty 1200

## 2018-08-06 MED ORDER — ATORVASTATIN CALCIUM 10 MG PO TABS: 10.0000 mg | ORAL_TABLET | Freq: Every day | ORAL | Status: DC

## 2018-08-06 NOTE — Telephone Encounter (Signed)
-----   Message from Sondra Barges, PA-C sent at 08/06/2018 10:25 AM EDT ----- Can we please have this patient come in for TCM in 14 days?

## 2018-08-06 NOTE — Discharge Instructions (Signed)
Diet and activity as tolerated.

## 2018-08-06 NOTE — Progress Notes (Signed)
Progress Note  Patient Name: Jerry Delgado Date of Encounter: 08/06/2018  Primary Cardiologist: New to [facility] - consult by Kirke Corin  Subjective   No acute overnight events. Ruled out. Echo with an EF of 60-65%, no RWMA. TSH normal, magnesium 3.1, K+ 3.5. Remains in Afib with ventricular rates in the 110s at rest. He is for [facility] this morning.   Inpatient Medications    Scheduled Meds: . amLODipine  5 mg Oral Daily   And  . benazepril  20 mg Oral Daily  . aspirin  81 mg Oral Daily  . bisoprolol-hydrochlorothiazide  1 tablet Oral Daily  . docusate sodium  100 mg Oral BID  . famotidine  20 mg Oral BID  . multivitamin with minerals  1 tablet Oral Daily  . potassium chloride  20 mEq Oral BID  . vitamin C  1,000 mg Oral Daily   Continuous Infusions: . heparin 1,250 Units/hr (08/06/18 0444)   PRN Meds: acetaminophen **OR** acetaminophen, ondansetron **OR** ondansetron (ZOFRAN) IV   Vital Signs    Vitals:   08/05/18 1130 08/05/18 1200 08/05/18 1912 08/06/18 0403  BP: 116/79 123/81 124/88 (!) 124/92  Pulse: 82 89 87 90  Resp:  20 17 17   Temp:  98 F (36.7 C) 98.6 F (37 C) 98.5 F (36.9 C)  TempSrc:  Oral Oral Oral  SpO2: 95% 99% 96% 97%  Weight:  79.7 kg  80.1 kg  Height:  5\' 8"  (1.727 m)      Intake/Output Summary (Last 24 hours) at 08/06/2018 0855 Last data filed at 08/05/2018 2127 Gross per 24 hour  Intake 345.73 ml  Output 500 ml  Net -154.27 ml   Filed Weights   08/05/18 0510 08/05/18 1200 08/06/18 0403  Weight: 80.7 kg 79.7 kg 80.1 kg    Telemetry    Afib with RVR, 110s bpm - Personally Reviewed  ECG    n/a - Personally Reviewed  Physical Exam   GEN: No acute distress.   Neck: No JVD. Cardiac: Tachycardic, irregularly irregular, no murmurs, rubs, or gallops.  Respiratory: Clear to auscultation bilaterally.  GI: Soft, nontender, non-distended.   MS: No edema; No deformity. Neuro:  Alert and oriented x 3; Nonfocal.  Psych: Normal  affect.  Labs    Chemistry Recent Labs  Lab 08/05/18 0513  NA 144  K 3.5  CL 106  CO2 31  GLUCOSE 120*  BUN 16  CREATININE 1.00  CALCIUM 8.6*  PROT 6.9  ALBUMIN 4.2  AST 20  ALT 17  ALKPHOS 63  BILITOT 0.8  GFRNONAA >60  GFRAA >60  ANIONGAP 7     Hematology Recent Labs  Lab 08/05/18 0513 08/06/18 0314  WBC 10.0 9.7  RBC 5.04 4.98  HGB 15.9 15.3  HCT 45.3 44.3  MCV 89.9 89.0  MCH 31.5 30.7  MCHC 35.1 34.5  RDW 12.0 11.9  PLT 240 228    Cardiac Enzymes Recent Labs  Lab 08/05/18 0513 08/05/18 1324  TROPONINI <0.03 <0.03   No results for input(s): TROPIPOC in the last 168 hours.   BNP Recent Labs  Lab 08/05/18 0513  BNP 170.0*     DDimer No results for input(s): DDIMER in the last 168 hours.   Radiology    Dg Chest Port 1 View  Result Date: 08/05/2018 IMPRESSION: No active disease. Electronically Signed   By: Deatra Robinson M.D.   On: 08/05/2018 05:21    Cardiac Studies   2-D Echo 08/05/2018: Study  Conclusions  - Left ventricle: The cavity size was normal. There was mild   concentric hypertrophy. Systolic function was normal. The   estimated ejection fraction was in the range of 60% to 65%. Wall   motion was normal; there were no regional wall motion   abnormalities. - Pulmonary arteries: Systolic pressure could not be accurately   estimated.  Patient Profile     62 y.o. male with history of HTN and palpitations who is being seen today for the evaluation of new onset Afib and abnormal EKG.  Assessment & Plan    1. New onset Afib: -Remains in Aifb with ventricular rates in the 110s bpm -Replete potassium to goal 4.0 -Check magnesium and TSH -CHADS2VASc at least 1 (HTN) -Continue bisoprolol for rate control -Uncertain how long he has been in Afib as he notes intermittent episodes of tachy-palpitations over the past several months -Continue heparin gtt with plans to transition to DOAC once it is clear he will not require any  invasive procedures (following Myoview) -If Myoview is low risk, recommend stopping heparin gtt and placing him on Eliquis 5 mg bid -Once he has started on a DOAC, would stop ASA -If he does not spontaneously convert he will need DCCV as an outpatient after he has been adequately anticoagulated without interruption for 3-4 weeks. If his ventricular rates become difficult to control prior to discharge, he would need a TEE/DCCV prior to going home -As along as he does not have further Afib following restoration of sinus rhythm, given his CAHDS2VASc of 1, he will not likely require long term anticoagulation  -Ambulate to assess rate control -Given tachycardic ventricular rates this morning, change amlodipine to diltiazem 120 mg daily -Ischemic evaluation as below -Consider evaluation for PE given Afib and syncope, defer to IM  2. Abnormal EKG: -Baseline unknown  -Troponin negative x 2 -Echo reassuring as above -Lexiscan Myoview this morning  3. Likely vasovagal syncope: -Possibly in the setting of BM -Echo with normal LVSF as above -Monitor on telemetry -Consider carotid artery ultrasound and PE evaluation, defer to IM  -Ischemic evaluation as above  4. HTN: -Blood pressure well controlled -Continue amlodipine, benazepril, bisoprolol, and HCTZ  5. Hyperglycemia: -A1c 4.7  6. HLD: -LDL 164 -Add on LFT -Start Lipitor 10 mg daily -Will need outpatient lipid and liver function in ~ 8 weeks  For questions or updates, please contact [facility] Please consult [email or web address] for contact info under Cardiology/STEMI.    Signed, Eula Listen, PA-C [facility] Pager: [phone] 08/06/2018, 8:55 AM

## 2018-08-06 NOTE — Progress Notes (Signed)
ANTICOAGULATION CONSULT NOTE - Initial Consult  Pharmacy Consult for Heparin Indication: atrial fibrillation  Allergies  Allergen Reactions  . Codeine Nausea And Vomiting    Patient Measurements: Height: 5\' 8"  (172.7 cm) Weight: 175 lb 11.2 oz (79.7 kg) IBW/kg (Calculated) : 68.4 Heparin Dosing Weight: 79.7 kg  Vital Signs: Temp: 98.5 F (36.9 C) (10/10 0403) Temp Source: Oral (10/10 0403) BP: 124/92 (10/10 0403) Pulse Rate: 90 (10/10 0403)  Labs: Recent Labs    08/05/18 0513 08/05/18 1324 08/05/18 1325 08/05/18 2123 08/06/18 0314  HGB 15.9  --   --   --  15.3  HCT 45.3  --   --   --  44.3  PLT 240  --   --   --  228  APTT  --   --  28  --   --   LABPROT  --   --  12.6  --   --   INR  --   --  0.95  --   --   HEPARINUNFRC  --   --   --  0.30 0.24*  CREATININE 1.00  --   --   --   --   TROPONINI <0.03 <0.03  --   --   --     Estimated Creatinine Clearance: 74.1 mL/min (by C-G formula based on SCr of 1 mg/dL).   Medical History: Past Medical History:  Diagnosis Date  . Hypertension      Assessment: 63 yo male with new onset AFib to start on heparin drip per cardiology.   Goal of Therapy:  Heparin level 0.3-0.7 units/ml Monitor platelets by anticoagulation protocol: Yes   Plan:  10/10 @ 0300 HL 0.24 subtherapeutic. Will rebolus w/ heparin 1000 units IV x 1 and increase rate to 1250 units/hr and will recheck HL @ 1000. CBC stable will continue to monitor.   Thomasene Ripple, PharmD, BCPS Clinical Pharmacist 08/06/2018

## 2018-08-06 NOTE — Telephone Encounter (Signed)
Patient is scheduled for TCM on 08/20/18 to see Alycia Rossetti

## 2018-08-06 NOTE — Plan of Care (Signed)

## 2018-08-06 NOTE — Care Management Note (Signed)
Case Management Note  Patient Details  Name: Jerry Delgado MRN: [id number] Date of Birth: 04-27-1956  Subjective/Objective:       Independent in all adls, denies issues accessing medical care, obtaining medications or with transportation.  Current with PCP.  No discharge needs identified at present by care manager or members of care team            Action/Plan:Eliquis coupon given   Expected Discharge Date:  08/06/18               Expected Discharge Plan:  Home w Home Health Services  In-House Referral:     Discharge planning Services  CM Consult  Post Acute Care Choice:    Choice offered to:     DME Arranged:    DME Agency:     HH Arranged:    HH Agency:     Status of Service:  Completed, signed off  If discussed at [employer], dates discussed:    Additional Comments:  Sherren Kerns, RN 08/06/2018, 1:54 PM

## 2018-08-07 NOTE — Telephone Encounter (Signed)
Correct, Internal Medicine should have stopped the ASA at discharge. Please stop ASA and take only Eliquis in an effort to minimize bleeding risk.

## 2018-08-07 NOTE — Telephone Encounter (Signed)
Patient contacted regarding discharge from [facility] on 08/06/18.   Patient understands to follow up with provider ? On 08/20/18 at 10am at [place].  Patient understands discharge instructions? Yes Patient understands medications and regiment?  No  Patient states he was not sure if he should continue his Aspirin along with the Eliquis. PA note: "-Once he has started on a DOAC, would stop ASA" However, not stopped at discharge.  Patient understands to bring all medications to this visit? Yes   Routing to [employer], PA-C to address aspirin.

## 2018-08-07 NOTE — Discharge Summary (Signed)
[facility] [place] [facility]   PATIENT NAME: Jerry Delgado    MR#:  [id number]  DATE OF BIRTH:  1956-10-10  DATE OF ADMISSION:  08/05/2018 ADMITTING PHYSICIAN: Arnaldo Natal, MD  DATE OF DISCHARGE: 08/06/2018  2:02 PM  PRIMARY CARE PHYSICIAN: Dione Housekeeper, MD   ADMISSION DIAGNOSIS:  Syncope, cardiogenic [R55] Atrial fibrillation, unspecified type (HCC) [I48.91]  DISCHARGE DIAGNOSIS:  Active Problems:   Arrhythmia  SECONDARY DIAGNOSIS:   Past Medical History:  Diagnosis Date  . History of echocardiogram    a. TTE 10/19: EF 60-65%, no RWMA  . Hypertension   . PAF (paroxysmal atrial fibrillation) (HCC)    a. diagnosed 07/2018; b. CHADS2VASc 1 (age); c. Eliquis     ADMITTING HISTORY  Chief Complaint: Fainting HPI: The patient with past medical history of high blood pressure presents to the emergency department after an episode of syncope on the commode.  The patient denies chest pain or shortness of breath.  He also denies palpitations, nausea, vomiting or diaphoresis.  In the emergency department he was found to have new onset atrial fibrillation.  Despite his rate control the patient had some ST depressions in lead III and aVF which prompted the emergency department staff to call the hospitalist for further evaluation.  HOSPITAL COURSE:   *Vasovagal syncope *Atrial fibrillation  Patient admitted to telemetry floor.  Patient was on bisoprolol at home.  Started on diltiazem.  Heparin initially and later transitioned to Eliquis.  Patient had echocardiogram done which showed no significant abnormalities.  No further syncopal episodes.  Cleared for discharge by cardiology.  Patient discharged home in stable condition.  CONSULTS OBTAINED:  Treatment Team:  Yvonne Kendall, MD Antonieta Iba, MD  DRUG ALLERGIES:   Allergies  Allergen Reactions  . Codeine Nausea And Vomiting    DISCHARGE MEDICATIONS:   Allergies as of  08/06/2018      Reactions   Codeine Nausea And Vomiting      Medication List    TAKE these medications   amLODipine-benazepril 5-20 MG capsule Commonly known as:  LOTREL Take 1 capsule by mouth 2 (two) times daily.   apixaban 5 MG Tabs tablet Commonly known as:  ELIQUIS Take 1 tablet (5 mg total) by mouth 2 (two) times daily.   aspirin 81 MG chewable tablet Chew 81 mg by mouth daily.   benazepril 20 MG tablet Commonly known as:  LOTENSIN Take 1 tablet (20 mg total) by mouth daily.   bisoprolol-hydrochlorothiazide 5-6.25 MG tablet Commonly known as:  ZIAC Take 1 tablet by mouth daily.   diltiazem 120 MG 24 hr capsule Commonly known as:  CARDIZEM CD Take 1 capsule (120 mg total) by mouth daily.   multivitamin with minerals Tabs tablet Take 1 tablet by mouth daily.   Potassium Chloride ER 20 MEQ Tbcr Take 1 tablet by mouth 3 (three) times daily.   ranitidine 150 MG tablet Commonly known as:  ZANTAC Take 150 mg by mouth 2 (two) times daily. Notes to patient:  NO MORNING DOSE GIVEN   Turmeric 500 MG Caps Take 1 capsule by mouth daily. Notes to patient:  NONE GIVEN TODAY   vitamin C 500 MG tablet Commonly known as:  ASCORBIC ACID Take 1,000 mg by mouth daily.       Today   VITAL SIGNS:  Blood pressure (!) 124/92, pulse 90, temperature 98.5 F (36.9 C), temperature source Oral, resp. rate 17, height 5\' 8"  (1.727 m), weight 80.1 kg,  SpO2 97 %.  I/O:  No intake or output data in the 24 hours ending 08/07/18 1432  PHYSICAL EXAMINATION:  Physical Exam  GENERAL:  62 y.o.-year-old patient lying in the bed with no acute distress.  LUNGS: Normal breath sounds bilaterally, no wheezing, rales,rhonchi or crepitation. No use of accessory muscles of respiration.  CARDIOVASCULAR: S1, S2 normal. No murmurs, rubs, or gallops.  ABDOMEN: Soft, non-tender, non-distended. Bowel sounds present. No organomegaly or mass.  NEUROLOGIC: Moves all 4 extremities. PSYCHIATRIC: The  patient is alert and oriented x 3.  SKIN: No obvious rash, lesion, or ulcer.   DATA REVIEW:   CBC Recent Labs  Lab 08/06/18 0314  WBC 9.7  HGB 15.3  HCT 44.3  PLT 228    Chemistries  Recent Labs  Lab 08/05/18 0513 08/05/18 1324  NA 144  --   K 3.5  --   CL 106  --   CO2 31  --   GLUCOSE 120*  --   BUN 16  --   CREATININE 1.00  --   CALCIUM 8.6*  --   MG  --  3.1*  2.9*  AST 20  --   ALT 17  --   ALKPHOS 63  --   BILITOT 0.8  --     Cardiac Enzymes Recent Labs  Lab 08/05/18 1324  TROPONINI <0.03    Microbiology Results  No results found for this or any previous visit.  RADIOLOGY:  Nm Myocar Multi W/spect W/wall Motion / Ef  Result Date: 08/06/2018  T wave inversion was noted during stress in the II and III leads.  There was no ST segment deviation noted during stress.  The study is normal.  This is a low risk study.  The left ventricular ejection fraction is normal (55-65%).     Follow up with PCP in 1 week.  Management plans discussed with the patient, family and they are in agreement.  CODE STATUS:  Code Status History    Date Active Date Inactive Code Status Order ID Comments User Context   08/05/2018 1111 08/06/2018 1707 Full Code [id number]  Arnaldo Natal, MD ED      TOTAL TIME TAKING CARE OF THIS PATIENT ON DAY OF DISCHARGE: more than 30 minutes.   Molinda Bailiff Leshea Jaggers M.D on 08/07/2018 at 2:32 PM  Between 7am to 6pm - Pager - [phone]  After 6pm go to [email or web address] - password [email or web address] [facility]  [facility]  Office  [phone]  CC: Primary care physician; Dione Housekeeper, MD  Note: This dictation was prepared with Dragon dictation along with smaller phrase technology. Any transcriptional errors that result from this process are unintentional.

## 2018-08-07 NOTE — Telephone Encounter (Signed)
Called and instructed patient to stop ASA  To minimize bleeding risk and continue taking Eliquis 5 mg BID. Patient verbalized understanding and thanked me for the call.

## 2018-08-18 ENCOUNTER — Telehealth: Payer: Self-pay | Admitting: Physician Assistant

## 2018-08-18 NOTE — Telephone Encounter (Signed)
Pt states he just went to look into the mirror and his eye is red, looks like a blood vessel has burst. . States he is on blood thinner.

## 2018-08-19 NOTE — Progress Notes (Signed)
Cardiology Office Note Date:  08/20/2018  Patient ID:  Jerry, Delgado 05-Jul-1956, MRN [id number] PCP:  Dione Housekeeper, MD  Cardiologist:  Dr. Kirke Corin, MD    Chief Complaint: Hospital follow up  History of Present Illness: Jerry Delgado is a 62 y.o. male with history of recently diagnosed Afib in early 07/2018 on Eliquis, HTN, HLD, and vasovagal syncope who presents for hospital follow up after his recent admission to [facility] from 10/9-10/10 for new on set Afib and likely vasovagal syncope.   Prior to the above hospital admission, he did not have any previously known cardiac history. He did note intermittent palpitations for the prior several months leading up to his admission. He was admitted on 10/9 following a syncopal episode associated with a bowel movement. He was noted to be in new onset Afib with reasonably controlled ventricular rates. Echo on 08/05/2018 showed an EF of 60-65%, normal wall motion, no significant valvular abnormalities. Cardiac enzymes negative x 2, BNP 170, TSH normal, magnesium at goal, potassium 3.5, LDL 164, A1c 4.7. Lexiscan Myoview on 08/06/2018 was negative for significant ischemia. He remained in Afib with well controlled ventricular rates. His amlodipine was changed to diltiazem, he was continued on bisoprolol and was started on Eliquis in place of ASA. He called on 10/22 noting redness in one of his eyes that had improved upon RN call on 10/23.   He comes in accompanied by his wife today and is doing well. He feels like her converted back to sinus rhythm ~ 4-5 days following his discharge. Heart rate since has been in the upper 40s to 50s bpm on average. BP has been in the 160s systolic mostly. No falls, BRBPR, or melena. Tolerating Eliquis without issues. No dizziness, presyncope, or syncope. No chest pain or diaphoresis. Wants to get back into the gym. Eating a healthier diet.   Past Medical History:  Diagnosis Date  . History of echocardiogram    a.  TTE 10/19: EF 60-65%, no RWMA  . Hypertension   . PAF (paroxysmal atrial fibrillation) (HCC)    a. diagnosed 07/2018; b. CHADS2VASc 1 (age); c. Eliquis    Past Surgical History:  Procedure Laterality Date  . HERNIA REPAIR    . shattered heel  2012    Current Meds  Medication Sig  . apixaban (ELIQUIS) 5 MG TABS tablet Take 1 tablet (5 mg total) by mouth 2 (two) times daily.  . benazepril (LOTENSIN) 20 MG tablet Take 1 tablet (20 mg total) by mouth daily.  . bisoprolol-hydrochlorothiazide (ZIAC) 5-6.25 MG tablet Take 1 tablet by mouth daily.  Marland Kitchen diltiazem (CARDIZEM CD) 120 MG 24 hr capsule Take 1 capsule (120 mg total) by mouth daily.  . Multiple Vitamin (MULTIVITAMIN WITH MINERALS) TABS tablet Take 1 tablet by mouth daily.  . Potassium Chloride ER 20 MEQ TBCR Take 1 tablet by mouth 3 (three) times daily.  . ranitidine (ZANTAC) 150 MG tablet Take 150 mg by mouth 2 (two) times daily.  . Turmeric 500 MG CAPS Take 1 capsule by mouth daily.  . vitamin C (ASCORBIC ACID) 500 MG tablet Take 1,000 mg by mouth daily.     Allergies:   Codeine   Social History:  The patient  reports that he has never smoked. He has never used smokeless tobacco. He reports that he does not drink alcohol or use drugs.   Family History:  The patient's family history includes Hypertension in his father.  ROS:   Review of  Systems  Constitutional: Negative for chills, diaphoresis, fever, malaise/fatigue and weight loss.  HENT: Negative for congestion.   Eyes: Negative for discharge and redness.  Respiratory: Negative for cough, hemoptysis, sputum production, shortness of breath and wheezing.   Cardiovascular: Negative for chest pain, palpitations, orthopnea, claudication, leg swelling and PND.  Gastrointestinal: Negative for abdominal pain, blood in stool, heartburn, melena, nausea and vomiting.  Genitourinary: Negative for hematuria.  Musculoskeletal: Negative for falls and myalgias.  Skin: Negative for rash.    Neurological: Negative for dizziness, tingling, tremors, sensory change, speech change, focal weakness, loss of consciousness and weakness.  Endo/Heme/Allergies: Does not bruise/bleed easily.  Psychiatric/Behavioral: Negative for substance abuse. The patient is not nervous/anxious.   All other systems reviewed and are negative.    PHYSICAL EXAM: * VS:  BP (!) 160/90 (BP Location: Left Arm, Patient Position: Sitting, Cuff Size: Normal)   Pulse (!) 52   Ht 5\' 8"  (1.727 m)   Wt 181 lb 8 oz (82.3 kg)   BMI 27.60 kg/m  BMI: Body mass index is 27.6 kg/m.  Physical Exam  Constitutional: He is oriented to person, place, and time. He appears well-developed and well-nourished.  HENT:  Head: Normocephalic and atraumatic.  Eyes: Right eye exhibits no discharge. Left eye exhibits no discharge.  Neck: Normal range of motion. No JVD present.  Cardiovascular: Regular rhythm, S1 normal, S2 normal and normal heart sounds. Bradycardia present. Exam reveals no distant heart sounds, no friction rub, no midsystolic click and no opening snap.  No murmur heard. Pulses:      Posterior tibial pulses are 2+ on the right side, and 2+ on the left side.  Pulmonary/Chest: Effort normal and breath sounds normal. No respiratory distress. He has no decreased breath sounds. He has no wheezes. He has no rales. He exhibits no tenderness.  Abdominal: Soft. He exhibits no distension. There is no tenderness.  Musculoskeletal: He exhibits no edema.  Neurological: He is alert and oriented to person, place, and time.  Skin: Skin is warm and dry. No cyanosis. Nails show no clubbing.  Psychiatric: He has a normal mood and affect. His speech is normal and behavior is normal. Judgment and thought content normal.     EKG:  Was ordered and interpreted by me today. Shows sinus bradycardia, 52 bpm, no acute st/t changes   Recent Labs: 08/05/2018: ALT 17; B Natriuretic Peptide 170.0; BUN 16; Creatinine, Ser 1.00; Magnesium 2.9;  Magnesium 3.1; Potassium 3.5; Sodium 144; TSH 4.241 08/06/2018: Hemoglobin 15.3; Platelets 228  08/05/2018: Cholesterol 232; HDL 53; LDL Cholesterol 164; Total CHOL/HDL Ratio 4.4; Triglycerides 75; VLDL 15   Estimated Creatinine Clearance: 80.2 mL/min (by C-G formula based on SCr of 1 mg/dL).   Wt Readings from Last 3 Encounters:  08/20/18 181 lb 8 oz (82.3 kg)  08/06/18 176 lb 8 oz (80.1 kg)    Orthostatic vital signs: Lying: 179/94, 54 bpm Sitting: 166/94, 61 bpm Standing: 173/96, 62 bpm Standing x 3 min: 191/104, 62 bpm  Other studies reviewed: Additional studies/records reviewed today include: summarized above  ASSESSMENT AND PLAN:  1. Persistent Afib: Currently in sinus rhythm with a bradycardic rate. Stop diltiazem. Continue bisoprolol. Continue Eliquis 5 mg bid, likely for the next 4 weeks, then can discontinue if ok with MD given his CHADS2VASc is 1 (HTN). Should he have a recurrence of Afib, would need to place on long term OAC. He did sneeze once leading to some mild bleeding into the right eye and this has  resolved. No vision issues. Check bmet and magnesium.   2. HTN: Blood pressure is elevated today. Stop diltiazem as above. Increase benazepril to 40 mg daily. Continue Ziac 5/6.25 mg daily. Monitor BP, call with elevated of soft readings.   3. HLD: Recent LDL from 08/05/2018 of 164 with normal LFT at that time. He is working on a healthier diet and getting back into the gym. Plan to recheck lipid panel in follow up at 4 weeks. If LDL remains significantly elevated at that time, consider starting statin.   4. Vasovagal syncope: No further episodes. Likely vasovagal in the setting of BM.   Disposition: F/u with Dr. Kirke Corin or an APP in 4 weeks.   Current medicines are reviewed at length with the patient today.  The patient did not have any concerns regarding medicines.  Signed, Eula Listen, PA-C 08/20/2018 10:06 AM     [facility] [place] [address] [place], [place] [address] [phone]

## 2018-08-19 NOTE — Telephone Encounter (Signed)
Returned the call to the patient. He stated that yesterday his eye looked like it had a blood vessel burst and he was concerned since he is on Eliquis. He stated that it is much better today and not as red. He has an appointment tomorrow at this office.

## 2018-08-20 ENCOUNTER — Ambulatory Visit (INDEPENDENT_AMBULATORY_CARE_PROVIDER_SITE_OTHER): Payer: BLUE CROSS/BLUE SHIELD | Admitting: Physician Assistant

## 2018-08-20 ENCOUNTER — Encounter: Payer: Self-pay | Admitting: Physician Assistant

## 2018-08-20 VITALS — BP 160/90 | HR 52 | Ht 68.0 in | Wt 181.5 lb

## 2018-08-20 DIAGNOSIS — I4819 Other persistent atrial fibrillation: Secondary | ICD-10-CM

## 2018-08-20 DIAGNOSIS — I10 Essential (primary) hypertension: Secondary | ICD-10-CM

## 2018-08-20 DIAGNOSIS — E785 Hyperlipidemia, unspecified: Secondary | ICD-10-CM

## 2018-08-20 DIAGNOSIS — R55 Syncope and collapse: Secondary | ICD-10-CM | POA: Diagnosis not present

## 2018-08-20 MED ORDER — BENAZEPRIL HCL 40 MG PO TABS: 40.0000 mg | ORAL_TABLET | Freq: Every day | ORAL | 1 refills | Status: AC

## 2018-08-20 MED ORDER — APIXABAN 5 MG PO TABS: 5.0000 mg | ORAL_TABLET | Freq: Two times a day (BID) | ORAL | 0 refills | Status: DC

## 2018-08-20 NOTE — Patient Instructions (Addendum)
Medication Instructions:  INCREASE the Benazepril to 40 mg daily STOP the Diltiazem  If you need a refill on your cardiac medications before your next appointment, please call your pharmacy.   Lab work: Your provider would like for you to have the following labs today: BMET and Magnesium  If you have labs (blood work) drawn today and your tests are completely normal, you will receive your results only by: Marland Kitchen MyChart Message (if you have MyChart) OR . A paper copy in the mail If you have any lab test that is abnormal or we need to change your treatment, we will call you to review the results.  Testing/Procedures: None ordered  Follow-Up: Your physician recommends that you schedule a follow-up appointment in: 4 weeks with Eula Listen, PA

## 2018-08-21 LAB — BASIC METABOLIC PANEL
BUN/Creatinine Ratio: 12 (ref 10–24)
BUN: 13 mg/dL (ref 8–27)
CO2: 24 mmol/L (ref 20–29)
Calcium: 8.9 mg/dL (ref 8.6–10.2)
Chloride: 103 mmol/L (ref 96–106)
Creatinine, Ser: 1.1 mg/dL (ref 0.76–1.27)
GFR, EST AFRICAN AMERICAN: 83 mL/min/{1.73_m2} (ref >59)
GFR, EST NON AFRICAN AMERICAN: 72 mL/min/{1.73_m2} (ref >59)
Glucose: 88 mg/dL (ref 65–99)
POTASSIUM: 4 mmol/L (ref 3.5–5.2)
SODIUM: 143 mmol/L (ref 134–144)

## 2018-08-21 LAB — MAGNESIUM: Magnesium: 2.2 mg/dL (ref 1.6–2.3)

## 2018-08-31 ENCOUNTER — Telehealth: Payer: Self-pay | Admitting: Physician Assistant

## 2018-08-31 MED ORDER — AMLODIPINE BESYLATE 5 MG PO TABS: 5.0000 mg | ORAL_TABLET | Freq: Every day | ORAL | 2 refills | Status: DC

## 2018-08-31 NOTE — Telephone Encounter (Signed)
Discussed with Alycia Rossetti who saw patient on 08/20/18. Ryan reviewed chart and advised for him to start on amlodipine 5 mg by mouth once a day.   Called patient. Patient reports he is NOT having headache or dizziness right at this time. It has occurred twice for him today. Denies any other symptoms such as chest pain or shortness of breath. Patient verbalized understanding to start amlodipine 5 mg once a day. He will go to pharmacy to pick it up tonight. He will continue to monitor his BP and call if it remain greater than 140/90. Pt verbalized understanding to call 911 or go to the emergency room, if he develops any new or worsening symptoms. Rx sent to pharmacy.

## 2018-08-31 NOTE — Telephone Encounter (Signed)
Pt c/o BP issue: STAT if pt c/o blurred vision, one-sided weakness or slurred speech  1. What are your last 5 BP readings? Normal 150/90       yesterday 17/105 165/105       today now 199/112   2. Are you having any other symptoms (ex. Dizziness, headache, blurred vision, passed out)? Headache dizziness no vision issues   3. What is your BP issue? Too high worried he should do something

## 2018-09-07 ENCOUNTER — Telehealth: Payer: Self-pay | Admitting: Physician Assistant

## 2018-09-07 NOTE — Telephone Encounter (Signed)
Patient calling stating he will be coming in on 09/17/18 to see Jerry Delgado  But he is needing some samples on his Eliquis  He states just enough to get him to that date  Please advise

## 2018-09-08 NOTE — Telephone Encounter (Signed)
Patient declined medication management application.  He stated that he was going to be taken off of it at his office visit on the 21st of this month.   Medication Samples have been provided to the patient.  Drug name: Eliquis       Strength: 5MG        Qty: 3 boxes  LOT: [id number]  Exp.Date: 11/21  Nadara MustardBrittany N Mikiah Demond 2:05 PM 09/08/2018

## 2018-09-13 NOTE — Progress Notes (Signed)
Cardiology Office Note Date:  09/17/2018  Patient ID:  Jerry Delgado, DOB October 14, 1956, MRN [id number] PCP:  Dione Housekeeperlmedo, Mario Ernesto, MD  Cardiologist:  Dr. Kirke CorinArida, MD   Chief Complaint: Follow up  History of Present Illness: Jerry Delgado is a 62 y.o. male with history of recently diagnosed Afib in early 07/2018 on Eliquis, HTN, HLD, and vasovagal syncope who presents for follow up of his Afib.   Prior to the patient's recent admission in 07/2018, he did not have any previously known cardiac history. He did note intermittent palpitations for the prior several months leading up to his admission. He was admitted on 10/9 following a syncopal episode associated with a bowel movement. He was noted to be in new onset Afib with reasonably controlled ventricular rates. Echo on 08/05/2018 showed an EF of 60-65%, normal wall motion, no significant valvular abnormalities. Cardiac enzymes negative x 2, BNP 170, TSH normal, magnesium at goal, potassium 3.5, LDL 164, A1c 4.7. Lexiscan Myoview on 08/06/2018 was negative for significant ischemia. He remained in Afib with well controlled ventricular rates. His amlodipine was changed to diltiazem, he was continued on bisoprolol and was started on Eliquis in place of ASA. He called on 10/22 noting redness in one of his eyes that had improved upon RN call on 10/23. In hospital follow up on 10/24, he was doing well. He was noted to have spontaneously converted back to sinus rhythm and reported heart rates in the 40s to 50s bpm. He was tolerating Eliquis. His eye was back to baseline and was without any vision changes. Given his bradycardia, diltiazem was stopped. He was continued on bisoprolol and Eliquis. Labs checked on 10/24 showed a potassium of 4.3, SCr 1.1, Mg++ 2.2.   He comes in doing well today. No further known episodes of palpitations or Afib. Tolerating Eliquis without issues. No further issues with bleeding into the eye. 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 Prior to his above admission, he was taking amlodipine 5 mg bid along with benazepril and Ziac. Since restarting amlodipine 5 mg daily his BP has improved from the 200s to 190s down to 130s to 140s systolic. No falls since he was last seen. No BRBPR or melena. No dizziness, presyncope or syncope. No chest pain or SOB. He does not have any other concerns at this time.    Past Medical History:  Diagnosis Date  . History of echocardiogram    a. TTE 10/19: EF 60-65%, no RWMA  . Hypertension   . PAF (paroxysmal atrial fibrillation) (HCC)    a. diagnosed 07/2018; b. CHADS2VASc 1 (age); c. Eliquis    Past Surgical History:  Procedure Laterality Date  . HERNIA REPAIR    . shattered heel  2012    Current Meds  Medication Sig  . benazepril (LOTENSIN) 40 MG tablet Take 1 tablet (40 mg total) by mouth daily.  . bisoprolol-hydrochlorothiazide (ZIAC) 5-6.25 MG tablet Take 1 tablet by mouth daily.  . Multiple Vitamin (MULTIVITAMIN WITH MINERALS) TABS tablet Take 1 tablet by mouth daily.  . Potassium Chloride ER 20 MEQ TBCR Take 1 tablet by mouth 3 (three) times daily.  . ranitidine (ZANTAC) 150 MG tablet Take 150 mg by mouth 2 (two) times daily.  . Turmeric 500 MG CAPS Take 1 capsule by mouth daily.  . vitamin C (ASCORBIC ACID) 500 MG tablet Take 1,000 mg by mouth  daily.   . [DISCONTINUED] amLODipine (NORVASC) 5 MG tablet Take 1 tablet (5 mg total) by mouth daily.  . [DISCONTINUED] apixaban (ELIQUIS) 5 MG TABS tablet Take 1 tablet (5 mg total) by mouth 2 (two) times daily.    Allergies:   Codeine   Social History:  The patient  reports that he has never smoked. He has never used smokeless tobacco. He reports that he does not drink alcohol or use drugs.   Family History:  The patient's family history includes Hypertension in his father.  ROS:   Review of Systems  Constitutional: Negative for chills,  diaphoresis, fever, malaise/fatigue and weight loss.  HENT: Negative for congestion.   Eyes: Negative for discharge and redness.  Respiratory: Negative for cough, hemoptysis, sputum production, shortness of breath and wheezing.   Cardiovascular: Negative for chest pain, palpitations, orthopnea, claudication, leg swelling and PND.  Gastrointestinal: Negative for abdominal pain, blood in stool, heartburn, melena, nausea and vomiting.  Genitourinary: Negative for hematuria.  Musculoskeletal: Negative for falls and myalgias.  Skin: Negative for rash.  Neurological: Negative for dizziness, tingling, tremors, sensory change, speech change, focal weakness, loss of consciousness and weakness.  Endo/Heme/Allergies: Does not bruise/bleed easily.  Psychiatric/Behavioral: Negative for substance abuse. The patient is not nervous/anxious.   All other systems reviewed and are negative.    PHYSICAL EXAM:  VS:  BP (!) 178/100 (BP Location: Left Arm, Patient Position: Sitting, Cuff Size: Normal)   Pulse 67   Ht 5\' 8"  (1.727 m)   Wt 172 lb 12 oz (78.4 kg)   BMI 26.27 kg/m  BMI: Body mass index is 26.27 kg/m.  Physical Exam  Constitutional: He is oriented to person, place, and time. He appears well-developed and well-nourished.  HENT:  Head: Normocephalic and atraumatic.  Eyes: Right eye exhibits no discharge. Left eye exhibits no discharge.  Neck: Normal range of motion. No JVD present.  Cardiovascular: Normal rate, regular rhythm, S1 normal, S2 normal and normal heart sounds. Exam reveals no distant heart sounds, no friction rub, no midsystolic click and no opening snap.  No murmur heard. Pulses:      Posterior tibial pulses are 2+ on the right side, and 2+ on the left side.  Pulmonary/Chest: Effort normal and breath sounds normal. No respiratory distress. He has no decreased breath sounds. He has no wheezes. He has no rales. He exhibits no tenderness.  Abdominal: Soft. He exhibits no distension.  There is no tenderness.  Musculoskeletal: He exhibits no edema.  Neurological: He is alert and oriented to person, place, and time.  Skin: Skin is warm and dry. No cyanosis. Nails show no clubbing.  Psychiatric: He has a normal mood and affect. His speech is normal and behavior is normal. Judgment and thought content normal.     EKG:  Was ordered and interpreted by me today. Shows NSR, 67 bpm, no acute st/t changes   Recent Labs: 08/05/2018: ALT 17; B Natriuretic Peptide 170.0; TSH 4.241 08/06/2018: Hemoglobin 15.3; Platelets 228 08/20/2018: BUN 13; Creatinine, Ser 1.10; Magnesium 2.2; Potassium 4.0; Sodium 143  08/05/2018: Cholesterol 232; HDL 53; LDL Cholesterol 164; Total CHOL/HDL Ratio 4.4; Triglycerides 75; VLDL 15   CrCl cannot be calculated (Patient's most recent lab result is older than the maximum 21 days allowed.).   Wt Readings from Last 3 Encounters:  09/17/18 172 lb 12 oz (78.4 kg)  08/20/18 181 lb 8 oz (82.3 kg)  08/06/18 176 lb 8 oz (80.1 kg)     Other studies  reviewed: Additional studies/records reviewed today include: summarized above  ASSESSMENT AND PLAN:  1. Persistent Afib: Remains in sinus rhythm without any known recurrence of Afib or palpitations. Given his CHADS2VASc 1 (HTN) and he has been in sinus rhythm for > 4 weeks now we will discontinue Eliquis at this time. Discussed with Dr. Kirke Corin. Resume ASA 81 mg daily for primary prevention. Continue Ziac 5/6.25 mg daily. Bradycardic rates preclude escalation of rate control at this time.   2. HTN: Blood pressure is suboptimally controlled. Increase amlodipine to 5 mg bid (this is how he was taking amlodipine prior to his recent admission). Continue benazepril 40 mg daily along with Ziac 5/6.25 mg daily.   3. HLD: Recent LDL from 08/05/2018 of 164 with normal LFT at that time. Check lipid and liver function.   4. Vasovagal syncope: No further issues.   Disposition: F/u with Dr. Kirke Corin or an APP in 6 months.    Current medicines are reviewed at length with the patient today.  The patient did not have any concerns regarding medicines.  Signed, Eula Listen, PA-C 09/17/2018 8:25 AM     [facility] [place] [address] [place], [place] [address] [phone]

## 2018-09-17 ENCOUNTER — Ambulatory Visit (INDEPENDENT_AMBULATORY_CARE_PROVIDER_SITE_OTHER): Payer: BLUE CROSS/BLUE SHIELD | Admitting: Physician Assistant

## 2018-09-17 ENCOUNTER — Encounter: Payer: Self-pay | Admitting: Physician Assistant

## 2018-09-17 VITALS — BP 178/100 | HR 67 | Ht 68.0 in | Wt 172.8 lb

## 2018-09-17 DIAGNOSIS — R55 Syncope and collapse: Secondary | ICD-10-CM | POA: Diagnosis not present

## 2018-09-17 DIAGNOSIS — I4819 Other persistent atrial fibrillation: Secondary | ICD-10-CM

## 2018-09-17 DIAGNOSIS — E785 Hyperlipidemia, unspecified: Secondary | ICD-10-CM | POA: Diagnosis not present

## 2018-09-17 DIAGNOSIS — I10 Essential (primary) hypertension: Secondary | ICD-10-CM | POA: Diagnosis not present

## 2018-09-17 MED ORDER — ASPIRIN EC 81 MG PO TBEC: 81.0000 mg | DELAYED_RELEASE_TABLET | Freq: Every day | ORAL | 3 refills | Status: AC

## 2018-09-17 MED ORDER — AMLODIPINE BESYLATE 5 MG PO TABS: 5.0000 mg | ORAL_TABLET | Freq: Two times a day (BID) | ORAL | 3 refills | Status: DC

## 2018-09-17 MED ORDER — AMLODIPINE BESYLATE 5 MG PO TABS: 5.0000 mg | ORAL_TABLET | Freq: Every day | ORAL | 3 refills | Status: DC

## 2018-09-17 NOTE — Patient Instructions (Addendum)
Medication Instructions:  Your physician has recommended you make the following change in your medication:  Stop Eliquis  Start Aspirin 81 mg Daily  Start Norvasc 5 mg Two Times Daily   If you need a refill on your cardiac medications before your next appointment, please call your pharmacy.   Lab work: Your physician recommends that you return for lab work in: Today   If you have labs (blood work) drawn today and your tests are completely normal, you will receive your results only by: Marland Kitchen. MyChart Message (if you have MyChart) OR . A paper copy in the mail If you have any lab test that is abnormal or we need to change your treatment, we will call you to review the results.  Testing/Procedures: NONE   Follow-Up: At [facility], you and your health needs are our priority.  As part of our continuing mission to provide you with exceptional heart care, we have created designated Provider Care Teams.  These Care Teams include your primary Cardiologist (physician) and Advanced Practice Providers (APPs -  Physician Assistants and Nurse Practitioners) who all work together to provide you with the care you need, when you need it. You will need a follow up appointment in 6 months.  Please call our office 2 months in advance to schedule this appointment.  You may see Lorine BearsMuhammad Arida, MD or one of the following Advanced Practice Providers on your designated Care Team:   Nicolasa Duckinghristopher Berge, NP Eula Listenyan Dunn, PA-C . Marisue IvanJacquelyn Visser, PA-C  Any Other Special Instructions Will Be Listed Below (If Applicable). Thank you for choosing [facility]!    .Marland Kitchen

## 2018-09-18 LAB — HEPATIC FUNCTION PANEL
ALBUMIN: 4.3 g/dL (ref 3.6–4.8)
ALK PHOS: 66 IU/L (ref 39–117)
ALT: 15 IU/L (ref 0–44)
AST: 17 IU/L (ref 0–40)
Bilirubin Total: 0.6 mg/dL (ref 0.0–1.2)
Bilirubin, Direct: 0.15 mg/dL (ref 0.00–0.40)
TOTAL PROTEIN: 6.5 g/dL (ref 6.0–8.5)

## 2018-09-18 LAB — LIPID PANEL
CHOL/HDL RATIO: 3.9 ratio (ref 0.0–5.0)
Cholesterol, Total: 201 mg/dL — ABNORMAL HIGH (ref 100–199)
HDL: 52 mg/dL (ref >39)
LDL CALC: 135 mg/dL — AB (ref 0–99)
TRIGLYCERIDES: 69 mg/dL (ref 0–149)
VLDL CHOLESTEROL CAL: 14 mg/dL (ref 5–40)

## 2018-11-10 DIAGNOSIS — Z125 Encounter for screening for malignant neoplasm of prostate: Secondary | ICD-10-CM | POA: Diagnosis not present

## 2018-11-10 DIAGNOSIS — Z1211 Encounter for screening for malignant neoplasm of colon: Secondary | ICD-10-CM | POA: Diagnosis not present

## 2018-11-10 DIAGNOSIS — I10 Essential (primary) hypertension: Secondary | ICD-10-CM | POA: Diagnosis not present

## 2018-11-10 DIAGNOSIS — Z0000 Encounter for general adult medical examination without abnormal findings: Secondary | ICD-10-CM | POA: Diagnosis not present

## 2018-11-10 DIAGNOSIS — E876 Hypokalemia: Secondary | ICD-10-CM | POA: Diagnosis not present

## 2018-11-17 DIAGNOSIS — Z1211 Encounter for screening for malignant neoplasm of colon: Secondary | ICD-10-CM | POA: Diagnosis not present

## 2018-11-24 DIAGNOSIS — D225 Melanocytic nevi of trunk: Secondary | ICD-10-CM | POA: Diagnosis not present

## 2018-11-24 DIAGNOSIS — D485 Neoplasm of uncertain behavior of skin: Secondary | ICD-10-CM | POA: Diagnosis not present

## 2018-11-24 DIAGNOSIS — L821 Other seborrheic keratosis: Secondary | ICD-10-CM | POA: Diagnosis not present

## 2019-05-25 DIAGNOSIS — D225 Melanocytic nevi of trunk: Secondary | ICD-10-CM | POA: Diagnosis not present

## 2019-05-25 DIAGNOSIS — L821 Other seborrheic keratosis: Secondary | ICD-10-CM | POA: Diagnosis not present

## 2019-09-23 IMAGING — DX DG CHEST 1V PORT
1 series · 1 of 1 positions shown · non-contrast
Comparison: None.

CLINICAL DATA: Chest pain and syncope

EXAM:
PORTABLE CHEST 1 VIEW

[chest ap]
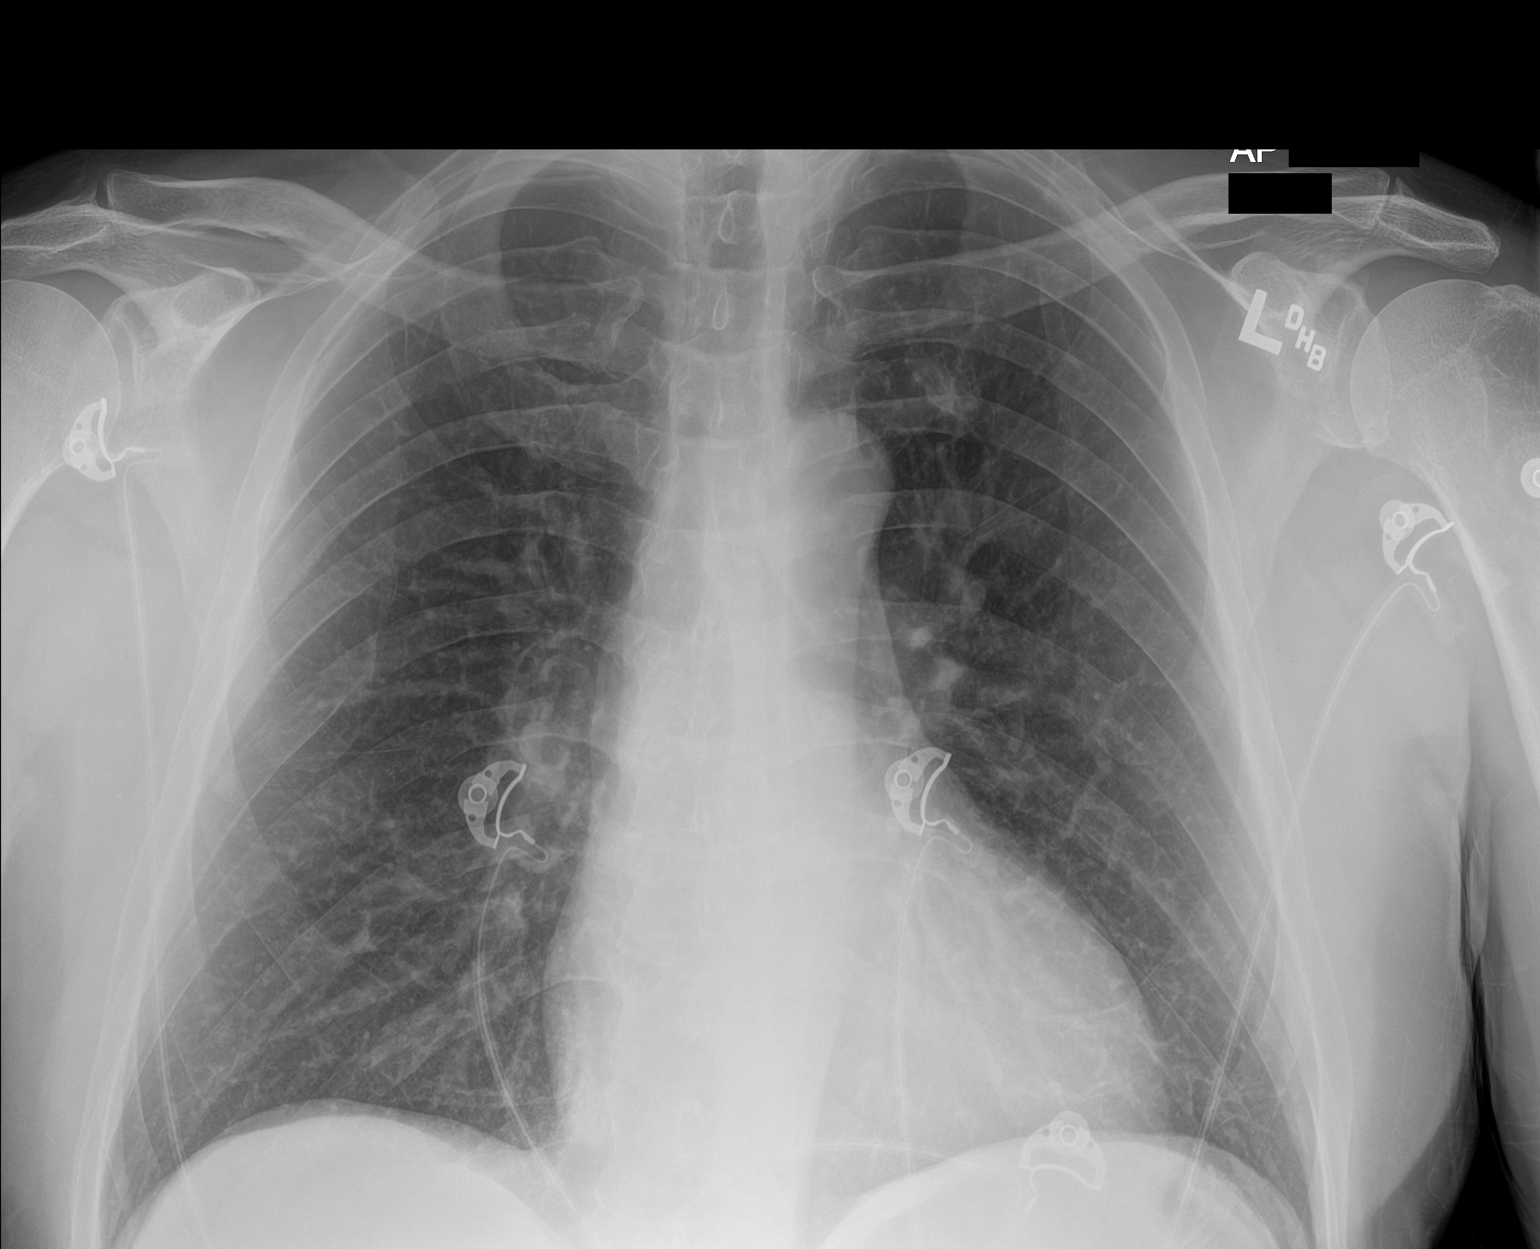

[1 of 1 positions shown; findings below may reference images not displayed]

FINDINGS: The heart size and mediastinal contours are within normal limits.
Both lungs are clear. The visualized skeletal structures are
unremarkable.
IMPRESSION: No active disease.

## 2020-01-06 DIAGNOSIS — Z0000 Encounter for general adult medical examination without abnormal findings: Secondary | ICD-10-CM | POA: Diagnosis not present

## 2020-01-06 DIAGNOSIS — E876 Hypokalemia: Secondary | ICD-10-CM | POA: Diagnosis not present

## 2020-01-06 DIAGNOSIS — Z125 Encounter for screening for malignant neoplasm of prostate: Secondary | ICD-10-CM | POA: Diagnosis not present

## 2020-01-06 DIAGNOSIS — Z1211 Encounter for screening for malignant neoplasm of colon: Secondary | ICD-10-CM | POA: Diagnosis not present

## 2020-01-06 DIAGNOSIS — I10 Essential (primary) hypertension: Secondary | ICD-10-CM | POA: Diagnosis not present

## 2020-01-12 DIAGNOSIS — Z1211 Encounter for screening for malignant neoplasm of colon: Secondary | ICD-10-CM | POA: Diagnosis not present

## 2020-04-06 ENCOUNTER — Ambulatory Visit: Payer: BLUE CROSS/BLUE SHIELD | Admitting: Nurse Practitioner

## 2020-04-25 NOTE — Progress Notes (Signed)
Cardiology Office Note    Date:  05/02/2020   ID:  Jerry Delgado, Jerry Delgado 04/26/1956, MRN [id number]  PCP:  Dione Housekeeper, MD  Cardiologist:  Lorine Bears, MD  Electrophysiologist:  None   Chief Complaint: Follow up  History of Present Illness:   Jerry Delgado is a 64 y.o. male with history of Afib diagnosed in early 07/2018 on Eliquis, HTN, HLD, and vasovagal syncope who presents for follow up of his Afib.   Prior to the patient's hospital admission in 07/2018, he did not have any previously known cardiac history. He did note intermittent palpitations for the prior several months leading up to his admission. He was admitted n 07/2018 following a syncopal episode associated with a bowel movement. He was noted to be in new onset Afib with reasonably controlled ventricular rates. Echo on 08/05/2018 showed an EF of 60-65%, normal wall motion, no significant valvular abnormalities. Cardiac enzymes negative x 2, BNP 170, TSH normal, magnesium at goal, potassium 3.5. Lexiscan Myoview on 08/06/2018 was negative for significant ischemia. He remained in Afib with well controlled ventricular rates. His amlodipine was changed to diltiazem, he was continued on bisoprolol and was started on Eliquis in place of ASA.In hospital follow up later in 07/2018, he was doing well and was noted to have spontaneously converted to sinus rhythm. He had noted some scleral erythema that had resolved without intervention. He was tolerating Eliquis. He was bradycardic with heart rates in the 40s to 50s bpm, leading his diltiazem to be held.  He was last seen in the office in 08/2018 and was doing well from a cardiac perspective.  He was tolerating Eliquis and bisoprolol without issue.  Given a CHA2DS2-VASc of 1 (HTN) and no further evidence of A. fib Eliquis was discontinued at that time.  Amlodipine was titrated to 5 mg twice daily and he was continued on benazepril along with Ziac.  He comes in doing well from a  heart perspective.  No chest pain, palpitations, dyspnea, dizziness, presyncope, syncope.  No lower extremity swelling.  Blood pressure at home has ranged from the 1 teens to 130s predominantly with a highest reading in the 140s over 80s.  Heart rate is typically in the 50s to 60s bpm.  He is tolerating all medications well issues.  He walks 5 to 6 miles per day without ischemic symptoms.  He continues to eat a heart healthy diet.  No issues or concerns at this time.   Labs independently reviewed: 12/2019 - TC 216, TG 55, HDL 64, LDL 141, potassium 3.4, BUN 7, serum creatinine 0.9, albumin 4.3, AST/ALT normal 07/2018 - Hgb 15.3, PLT 228, A1c 4.7, magnesium 3.1, TSH normal  Past Medical History:  Diagnosis Date  . History of echocardiogram    a. TTE 10/19: EF 60-65%, no RWMA  . Hypertension   . PAF (paroxysmal atrial fibrillation) (HCC)    a. diagnosed 07/2018; b. CHADS2VASc 1 (age); c. Eliquis    Past Surgical History:  Procedure Laterality Date  . HERNIA REPAIR    . shattered heel  2012    Current Medications: Current Meds  Medication Sig  . amLODipine (NORVASC) 10 MG tablet Take 10 mg by mouth daily.  Marland Kitchen aspirin EC 81 MG tablet Take 1 tablet (81 mg total) by mouth daily.  . benazepril (LOTENSIN) 40 MG tablet Take 1 tablet (40 mg total) by mouth daily.  . bisoprolol-hydrochlorothiazide (ZIAC) 5-6.25 MG tablet Take 1 tablet by mouth daily.  Marland Kitchen  Multiple Vitamin (MULTIVITAMIN WITH MINERALS) TABS tablet Take 1 tablet by mouth daily.  . Potassium Chloride ER 20 MEQ TBCR Take 1 tablet by mouth 3 (three) times daily.  . Turmeric 500 MG CAPS Take 1 capsule by mouth daily.  . vitamin C (ASCORBIC ACID) 500 MG tablet Take 1,000 mg by mouth daily.     Allergies:   Codeine   Social History   Socioeconomic History  . Marital status: Married    Spouse name: Not on file  . Number of children: Not on file  . Years of education: Not on file  . Highest education level: Not on file    Occupational History  . Not on file  Tobacco Use  . Smoking status: Never Smoker  . Smokeless tobacco: Never Used  Substance and Sexual Activity  . Alcohol use: Never  . Drug use: Never  . Sexual activity: Not on file  Other Topics Concern  . Not on file  Social History Narrative  . Not on file   Social Determinants of Health   Financial Resource Strain:   . Difficulty of Paying Living Expenses:   Food Insecurity:   . Worried About Running Out of Food in the Last Year:   . Baristaan Out of Food in the LProgramme researcher, broadcasting/film/videoast Year:   Transportation Needs:   . Freight forwarderLack of Transportation (Medical):   Marland Kitchen. Lack of Transportation (Non-Medical):   Physical Activity:   . Days of Exercise per Week:   . Minutes of Exercise per Session:   Stress:   . Feeling of Stress :   Social Connections:   . Frequency of Communication with Friends and Family:   . Frequency of Social Gatherings with Friends and Family:   . Attends Religious Services:   . Active Member of Clubs or Organizations:   . Attends BankerClub or Organization Meetings:   Marland Kitchen. Marital Status:      Family History:  The patient's family history includes Hypertension in his father.  ROS:   Review of Systems  Constitutional: Negative for chills, diaphoresis, fever, malaise/fatigue and weight loss.  HENT: Negative for congestion.   Eyes: Negative for discharge and redness.  Respiratory: Negative for cough, sputum production, shortness of breath and wheezing.   Cardiovascular: Negative for chest pain, palpitations, orthopnea, claudication, leg swelling and PND.  Gastrointestinal: Negative for abdominal pain, heartburn, nausea and vomiting.  Musculoskeletal: Negative for falls and myalgias.  Skin: Negative for rash.  Neurological: Negative for dizziness, tingling, tremors, sensory change, speech change, focal weakness, loss of consciousness and weakness.  Endo/Heme/Allergies: Does not bruise/bleed easily.  Psychiatric/Behavioral: Negative for substance abuse.  The patient is not nervous/anxious.   All other systems reviewed and are negative.    EKGs/Labs/Other Studies Reviewed:    Studies reviewed were summarized above. The additional studies were reviewed today:  2D echo 07/2018: - Left ventricle: The cavity size was normal. There was mild  concentric hypertrophy. Systolic function was normal. The  estimated ejection fraction was in the range of 60% to 65%. Wall  motion was normal; there were no regional wall motion  abnormalities.  - Pulmonary arteries: Systolic pressure could not be accurately  estimated.  __________  Eugenie BirksLexiscan MPI 07/2018:  T wave inversion was noted during stress in the II and III leads.  There was no ST segment deviation noted during stress.  The study is normal.  This is a low risk study.  The left ventricular ejection fraction is normal (55-65%).   EKG:  EKG  is ordered today.  The EKG ordered today demonstrates NSR, 60 bpm, TWI leads III and aVF (previously noted)  Recent Labs: No results found for requested labs within last 8760 hours.  Recent Lipid Panel    Component Value Date/Time   CHOL 201 (H) 09/17/2018 0829   TRIG 69 09/17/2018 0829   HDL 52 09/17/2018 0829   CHOLHDL 3.9 09/17/2018 0829   CHOLHDL 4.4 08/05/2018 1324   VLDL 15 08/05/2018 1324   LDLCALC 135 (H) 09/17/2018 0829    PHYSICAL EXAM:    VS:  BP (!) 160/90 (BP Location: Left Arm, Patient Position: Sitting, Cuff Size: Normal)   Pulse 60   Ht 5\' 8"  (1.727 m)   Wt 171 lb (77.6 kg)   SpO2 98%   BMI 26.00 kg/m   BMI: Body mass index is 26 kg/m.  Physical Exam Constitutional:      Appearance: He is well-developed.  HENT:     Head: Normocephalic and atraumatic.  Eyes:     General:        Right eye: No discharge.        Left eye: No discharge.  Neck:     Vascular: No JVD.  Cardiovascular:     Rate and Rhythm: Normal rate and regular rhythm.     Pulses: No midsystolic click and no opening snap.           Posterior tibial pulses are 2+ on the right side and 2+ on the left side.     Heart sounds: Normal heart sounds, S1 normal and S2 normal. Heart sounds not distant. No murmur heard.  No friction rub.  Pulmonary:     Effort: Pulmonary effort is normal. No respiratory distress.     Breath sounds: Normal breath sounds. No decreased breath sounds, wheezing or rales.  Chest:     Chest wall: No tenderness.  Abdominal:     General: There is no distension.     Palpations: Abdomen is soft.     Tenderness: There is no abdominal tenderness.  Musculoskeletal:     Cervical back: Normal range of motion.  Skin:    General: Skin is warm and dry.     Nails: There is no clubbing.  Neurological:     Mental Status: He is alert and oriented to person, place, and time.  Psychiatric:        Speech: Speech normal.        Behavior: Behavior normal.        Thought Content: Thought content normal.        Judgment: Judgment normal.     Wt Readings from Last 3 Encounters:  05/02/20 171 lb (77.6 kg)  09/17/18 172 lb 12 oz (78.4 kg)  08/20/18 181 lb 8 oz (82.3 kg)     ASSESSMENT & PLAN:   1. Persistent A. Fib: He remains in sinus rhythm without any further tachypalpitations.  Given his CHA2DS2-VASc of 1 (HTN) and in the setting of him being in sinus rhythm he remains off Eliquis.  Continue Ziac.  He is asymptomatic with mildly bradycardic heart rates.  2. HTN: Blood pressure is elevated in the office today though he does have a history of likely whitecoat hypertension.  Blood pressure is well controlled at home.  In this setting, no changes at this time.  Continue current dose of amlodipine, benazepril, and Ziac.  Low-sodium diet.  3. HLD: LDL 141 from 12/2019 with normal liver function at that time.  Consider calcium score  for further risk stratification in follow-up.  Previously he has tried atorvastatin though this led to myalgias and discontinuation.  If he is found to have significant coronary artery  calcium could consider trial of rosuvastatin versus Zetia.  Continue ASA 81 mg daily for primary prevention.  Plan for fasting lipid panel and liver function prior to his next visit.  4. Vasovagal syncope: No further episodes.  Disposition: F/u with Dr. Kirke Corin or an APP in 6 months.   Medication Adjustments/Labs and Tests Ordered: Current medicines are reviewed at length with the patient today.  Concerns regarding medicines are outlined above. Medication changes, Labs and Tests ordered today are summarized above and listed in the Patient Instructions accessible in Encounters.   Signed, Eula Listen, PA-C 05/02/2020 9:33 AM     [facility] - [place] [address] [place], [place] [address] [phone]

## 2020-05-02 ENCOUNTER — Encounter: Payer: Self-pay | Admitting: Physician Assistant

## 2020-05-02 ENCOUNTER — Ambulatory Visit (INDEPENDENT_AMBULATORY_CARE_PROVIDER_SITE_OTHER): Payer: BC Managed Care – PPO | Admitting: Physician Assistant

## 2020-05-02 ENCOUNTER — Other Ambulatory Visit: Payer: Self-pay

## 2020-05-02 VITALS — BP 160/90 | HR 60 | Ht 68.0 in | Wt 171.0 lb

## 2020-05-02 DIAGNOSIS — I4819 Other persistent atrial fibrillation: Secondary | ICD-10-CM | POA: Diagnosis not present

## 2020-05-02 DIAGNOSIS — R55 Syncope and collapse: Secondary | ICD-10-CM | POA: Diagnosis not present

## 2020-05-02 DIAGNOSIS — E785 Hyperlipidemia, unspecified: Secondary | ICD-10-CM | POA: Diagnosis not present

## 2020-05-02 DIAGNOSIS — I10 Essential (primary) hypertension: Secondary | ICD-10-CM | POA: Diagnosis not present

## 2020-05-02 NOTE — Patient Instructions (Signed)
Medication Instructions:  Your physician recommends that you continue on your current medications as directed. Please refer to the Current Medication list given to you today.  *If you need a refill on your cardiac medications before your next appointment, please call your pharmacy*   Lab Work: Your physician recommends that you return for a FASTING lipid profile and hepatin panel in 6 months.  Please have your labs drawn at the medical mall a couple of days priot to your next appointment. Their hours are Mon-Fri 7:30am-6pm.    If you have labs (blood work) drawn today and your tests are completely normal, you will receive your results only by: Marland Kitchen MyChart Message (if you have MyChart) OR . A paper copy in the mail If you have any lab test that is abnormal or we need to change your treatment, we will call you to review the results.   Testing/Procedures: None ordered   Follow-Up: At [facility], you and your health needs are our priority.  As part of our continuing mission to provide you with exceptional heart care, we have created designated Provider Care Teams.  These Care Teams include your primary Cardiologist (physician) and Advanced Practice Providers (APPs -  Physician Assistants and Nurse Practitioners) who all work together to provide you with the care you need, when you need it.  We recommend signing up for the patient portal called "MyChart".  Sign up information is provided on this After Visit Summary.  MyChart is used to connect with patients for Virtual Visits (Telemedicine).  Patients are able to view lab/test results, encounter notes, upcoming appointments, etc.  Non-urgent messages can be sent to your provider as well.   To learn more about what you can do with MyChart, go to [email or web address].    Your next appointment:   6 month(s)  The format for your next appointment:   In Person  Provider:    You may see Lorine Bears, MD or one of the following  Advanced Practice Providers on your designated Care Team:    Nicolasa Ducking, NP  Eula Listen, PA-C  Marisue Ivan, PA-C    Other Instructions N/A

## 2020-05-05 DIAGNOSIS — L821 Other seborrheic keratosis: Secondary | ICD-10-CM | POA: Diagnosis not present

## 2020-05-05 DIAGNOSIS — D225 Melanocytic nevi of trunk: Secondary | ICD-10-CM | POA: Diagnosis not present

## 2020-11-16 NOTE — Progress Notes (Signed)
Cardiology Office Note    Date:  11/24/2020   ID:  Helmer, Dull 1956/02/11, MRN [id number]  PCP:  Dione Housekeeper, MD  Cardiologist:  Lorine Bears, MD  Electrophysiologist:  None   Chief Complaint: Follow up  History of Present Illness:   Jerry Delgado is a 65 y.o. male with history of Afib diagnosed in early 07/2018 on Eliquis, HTN, HLD, and vasovagal syncopewho presents for follow up of his Afib.   Prior to his hospital admission in 07/2018, he did not have any previously known cardiac history.He did note intermittent palpitations for several months leading up to his admission. He was admitted in 07/2018 following a syncopal episode associated with a bowel movement. He was noted to be in new onset Afib with reasonably controlled ventricular rates. Echo on 08/05/2018 showed an EF of 60-65%, normal wall motion, no significant valvular abnormalities. Cardiac enzymes negative x 2, BNP 170, TSH normal, magnesium at goal, potassium 3.5. Lexiscan Myoview on 08/06/2018 was negative for significant ischemia. He remained in Afib with well controlled ventricular rates. His amlodipine was changed to diltiazem, he was continued on bisoprolol and was started on Eliquis in place of ASA.In hospital follow up later in 07/2018, he was doing well and was noted to have spontaneously converted to sinus rhythm. He had noted some scleral erythema that had resolved without intervention. He was tolerating Eliquis. He was bradycardic with heart rates in the 40s to 50s bpm, leading his diltiazem to be held.  He was seen in the office in 08/2018 and was doing well from a cardiac perspective.  He was tolerating Eliquis and bisoprolol without issue.  Given a CHA2DS2-VASc of 1 (HTN) and no further evidence of A. Fib, Eliquis was discontinued at that time.  Amlodipine was titrated to 5 mg twice daily and he was continued on benazepril along with Ziac.  He was last seen in the office in 04/2020 and was doing  well from a cardiac perspective. He was walking 5-6 miles per day without cardiac limitation.  No changes were made.   He comes in doing well from a cardiac perspective. No chest pain, dyspnea, palpitations, dizziness, presyncope, syncope, or falls. Blood pressure typically runs in the 130s over 70s at home. He is tolerating all medications without issues. He does not strictly watch his sodium intake though does not add salt to foods. With his busy job at times his diet is not as heart healthy as he would like. He does not have any issues or concerns at this time.   Labs independently reviewed: 10/2020 - TC 249, TG 85, HDL 62, LDL 170, albumin 4.6, AST/ALT normal 12/2019 - Potassium 3.4, BUN 7, serum creatinine 0.9 07/2018 - Hgb 15.3, PLT 228, A1c 4.7, magnesium 3.1, TSH normal   Past Medical History:  Diagnosis Date  . History of echocardiogram    a. TTE 10/19: EF 60-65%, no RWMA  . Hypertension   . PAF (paroxysmal atrial fibrillation) (HCC)    a. diagnosed 07/2018; b. CHADS2VASc 1 (age); c. Eliquis    Past Surgical History:  Procedure Laterality Date  . HERNIA REPAIR    . shattered heel  2012    Current Medications: Current Meds  Medication Sig  . amLODipine (NORVASC) 10 MG tablet Take 10 mg by mouth daily.  Marland Kitchen aspirin EC 81 MG tablet Take 1 tablet (81 mg total) by mouth daily.  . benazepril (LOTENSIN) 40 MG tablet Take 1 tablet (40 mg total) by  mouth daily.  . bisoprolol-hydrochlorothiazide (ZIAC) 5-6.25 MG tablet Take 1 tablet by mouth daily.  . Multiple Vitamin (MULTIVITAMIN WITH MINERALS) TABS tablet Take 1 tablet by mouth daily.  . Potassium Chloride ER 20 MEQ TBCR Take 1 tablet by mouth 3 (three) times daily.  . rosuvastatin (CRESTOR) 5 MG tablet Take 1 tablet (5 mg total) by mouth daily.  . Turmeric 500 MG CAPS Take 1 capsule by mouth daily.  . vitamin C (ASCORBIC ACID) 500 MG tablet Take 1,000 mg by mouth daily.   . [DISCONTINUED] potassium chloride SA (KLOR-CON) 20 MEQ  tablet Take 1 tablet by mouth 3 (three) times daily.    Allergies:   Codeine   Social History   Socioeconomic History  . Marital status: Married    Spouse name: Not on file  . Number of children: Not on file  . Years of education: Not on file  . Highest education level: Not on file  Occupational History  . Not on file  Tobacco Use  . Smoking status: Never Smoker  . Smokeless tobacco: Never Used  Substance and Sexual Activity  . Alcohol use: Never  . Drug use: Never  . Sexual activity: Not on file  Other Topics Concern  . Not on file  Social History Narrative  . Not on file   Social Determinants of Health   Financial Resource Strain: Not on file  Food Insecurity: Not on file  Transportation Needs: Not on file  Physical Activity: Not on file  Stress: Not on file  Social Connections: Not on file     Family History:  The patient's family history includes Hypertension in his father.  ROS:   Review of Systems  Constitutional: Negative for chills, diaphoresis, fever, malaise/fatigue and weight loss.  HENT: Negative for congestion.   Eyes: Negative for discharge and redness.  Respiratory: Negative for cough, sputum production, shortness of breath and wheezing.   Cardiovascular: Negative for chest pain, palpitations, orthopnea, claudication, leg swelling and PND.  Gastrointestinal: Negative for abdominal pain, heartburn, nausea and vomiting.  Musculoskeletal: Negative for falls and myalgias.  Skin: Negative for rash.  Neurological: Negative for dizziness, tingling, tremors, sensory change, speech change, focal weakness, loss of consciousness and weakness.  Endo/Heme/Allergies: Does not bruise/bleed easily.  Psychiatric/Behavioral: Negative for substance abuse. The patient is not nervous/anxious.   All other systems reviewed and are negative.    EKGs/Labs/Other Studies Reviewed:    Studies reviewed were summarized above. The additional studies were reviewed  today:  2D echo 07/2018: - Left ventricle: The cavity size was normal. There was mild  concentric hypertrophy. Systolic function was normal. The  estimated ejection fraction was in the range of 60% to 65%. Wall  motion was normal; there were no regional wall motion  abnormalities.  - Pulmonary arteries: Systolic pressure could not be accurately  estimated.  __________  Eugenie BirksLexiscan MPI 07/2018:  T wave inversion was noted during stress in the II and III leads.  There was no ST segment deviation noted during stress.  The study is normal.  This is a low risk study.  The left ventricular ejection fraction is normal (55-65%).    EKG:  EKG is ordered today.  The EKG ordered today demonstrates sinus bradycardia, 56 bpm, no acute st/t changes   Recent Labs: 11/22/2020: ALT 19  Recent Lipid Panel    Component Value Date/Time   CHOL 249 (H) 11/22/2020 0909   CHOL 201 (H) 09/17/2018 0829   TRIG 85 11/22/2020 0909  HDL 62 11/22/2020 0909   HDL 52 09/17/2018 0829   CHOLHDL 4.0 11/22/2020 0909   VLDL 17 11/22/2020 0909   LDLCALC 170 (H) 11/22/2020 0909   LDLCALC 135 (H) 09/17/2018 0829    PHYSICAL EXAM:    VS:  BP (!) 160/100 (BP Location: Left Arm, Patient Position: Sitting, Cuff Size: Normal)   Pulse (!) 56   Ht 5\' 8"  (1.727 m)   Wt 176 lb (79.8 kg)   SpO2 98%   BMI 26.76 kg/m   BMI: Body mass index is 26.76 kg/m.  Physical Exam Vitals reviewed.  Constitutional:      Appearance: He is well-developed and well-nourished.  HENT:     Head: Normocephalic and atraumatic.  Eyes:     General:        Right eye: No discharge.        Left eye: No discharge.  Neck:     Vascular: No JVD.  Cardiovascular:     Rate and Rhythm: Normal rate and regular rhythm.     Pulses: No midsystolic click and no opening snap.          Posterior tibial pulses are 2+ on the right side and 2+ on the left side.     Heart sounds: Normal heart sounds, S1 normal and S2 normal. Heart  sounds not distant. No murmur heard. No friction rub.  Pulmonary:     Effort: Pulmonary effort is normal. No respiratory distress.     Breath sounds: Normal breath sounds. No decreased breath sounds, wheezing or rales.  Chest:     Chest wall: No tenderness.  Abdominal:     General: There is no distension.     Palpations: Abdomen is soft.     Tenderness: There is no abdominal tenderness.  Musculoskeletal:        General: No edema.     Cervical back: Normal range of motion.  Skin:    General: Skin is warm and dry.     Nails: There is no clubbing or cyanosis.  Neurological:     Mental Status: He is alert and oriented to person, place, and time.  Psychiatric:        Mood and Affect: Mood and affect normal.        Speech: Speech normal.        Behavior: Behavior normal.        Thought Content: Thought content normal.        Judgment: Judgment normal.     Wt Readings from Last 3 Encounters:  11/24/20 176 lb (79.8 kg)  05/02/20 171 lb (77.6 kg)  09/17/18 172 lb 12 oz (78.4 kg)     ASSESSMENT & PLAN:   1. Persistent Afib: No symptoms concerning for recurrence of atrial arrhythmia. Given his CHA2DS2-VASc of 1, without other recurrence of A. fib, Eliquis has previously been discontinued. Without recurrence of arrhythmia no indication for resumption of OAC at this time. Continue bisoprolol. He is asymptomatic with mildly bradycardic heart rates.  2. HTN: Blood pressure is mildly elevated in the office of this is well controlled at home. Recommend low-sodium diet and continuing current medical therapy including amlodipine, benazepril, and bisoprolol/HCTZ.  3. HLD: LDL 170 with normal LFT earlier this month. Trial of Crestor 5 mg Monday, Wednesday, Friday. Previously, he was placed on statin therapy and did not tolerate this. If he does tolerate Crestor we will plan to follow-up a fasting lipid panel and liver function in 12 weeks.  Disposition: F/u  with Dr. Kirke Corin or an APP in 6  months.   Medication Adjustments/Labs and Tests Ordered: Current medicines are reviewed at length with the patient today.  Concerns regarding medicines are outlined above. Medication changes, Labs and Tests ordered today are summarized above and listed in the Patient Instructions accessible in Encounters.   Signed, Eula Listen, PA-C 11/24/2020 8:39 AM     [facility] [place] [address] [place], [place] [address] [phone]

## 2020-11-22 ENCOUNTER — Other Ambulatory Visit
Admission: RE | Admit: 2020-11-22 | Discharge: 2020-11-22 | Disposition: A | Discharge status: Home / Self Care | Payer: BLUE CROSS/BLUE SHIELD | Source: Ambulatory Visit | Attending: Physician Assistant | Admitting: Physician Assistant

## 2020-11-22 DIAGNOSIS — E785 Hyperlipidemia, unspecified: Secondary | ICD-10-CM | POA: Insufficient documentation

## 2020-11-22 LAB — HEPATIC FUNCTION PANEL
ALT: 19 U/L (ref 0–44)
AST: 24 U/L (ref 15–41)
Albumin: 4.6 g/dL (ref 3.5–5.0)
Alkaline Phosphatase: 68 U/L (ref 38–126)
Bilirubin, Direct: 0.1 mg/dL (ref 0.0–0.2)
Indirect Bilirubin: 1 mg/dL — ABNORMAL HIGH (ref 0.3–0.9)
Total Bilirubin: 1.1 mg/dL (ref 0.3–1.2)
Total Protein: 7.4 g/dL (ref 6.5–8.1)

## 2020-11-22 LAB — LIPID PANEL
Cholesterol: 249 mg/dL — ABNORMAL HIGH (ref 0–200)
HDL: 62 mg/dL (ref >40)
LDL Cholesterol: 170 mg/dL — ABNORMAL HIGH (ref 0–99)
Total CHOL/HDL Ratio: 4 RATIO
Triglycerides: 85 mg/dL (ref <150)
VLDL: 17 mg/dL (ref 0–40)

## 2020-11-24 ENCOUNTER — Ambulatory Visit (INDEPENDENT_AMBULATORY_CARE_PROVIDER_SITE_OTHER): Payer: BC Managed Care – PPO | Admitting: Physician Assistant

## 2020-11-24 ENCOUNTER — Encounter: Payer: Self-pay | Admitting: Physician Assistant

## 2020-11-24 ENCOUNTER — Other Ambulatory Visit: Payer: Self-pay

## 2020-11-24 VITALS — BP 160/100 | HR 56 | Ht 68.0 in | Wt 176.0 lb

## 2020-11-24 DIAGNOSIS — I10 Essential (primary) hypertension: Secondary | ICD-10-CM | POA: Diagnosis not present

## 2020-11-24 DIAGNOSIS — I4819 Other persistent atrial fibrillation: Secondary | ICD-10-CM

## 2020-11-24 DIAGNOSIS — E785 Hyperlipidemia, unspecified: Secondary | ICD-10-CM | POA: Diagnosis not present

## 2020-11-24 MED ORDER — ROSUVASTATIN CALCIUM 5 MG PO TABS: 5.0000 mg | ORAL_TABLET | Freq: Every day | ORAL | 3 refills | Status: DC

## 2020-11-24 NOTE — Patient Instructions (Addendum)
Medication Instructions:  Your physician has recommended you make the following change in your medication:  1. START Rosuvastatin 5 mg on Monday, Wednesday, and Friday.   *If you need a refill on your cardiac medications before your next appointment, please call your pharmacy*   Lab Work: Lipid & Liver panel to be done in 3 months around the end of April 2022. Go to the [facility] Entrance and check in at registration to have those done. No appointment is needed for these to be done. Make sure you do not eat or drink anything after midnight before except sip of water with your medications.    If you have labs (blood work) drawn today and your tests are completely normal, you will receive your results only by: Marland Kitchen MyChart Message (if you have MyChart) OR . A paper copy in the mail If you have any lab test that is abnormal or we need to change your treatment, we will call you to review the results.   Testing/Procedures: None   Follow-Up: At [facility], you and your health needs are our priority.  As part of our continuing mission to provide you with exceptional heart care, we have created designated Provider Care Teams.  These Care Teams include your primary Cardiologist (physician) and Advanced Practice Providers (APPs -  Physician Assistants and Nurse Practitioners) who all work together to provide you with the care you need, when you need it.   Your next appointment:   6 month(s)  The format for your next appointment:   In Person  Provider:   You may see Lorine Bears, MD or one of the following Advanced Practice Providers on your designated Care Team:    Nicolasa Ducking, NP  Eula Listen, PA-C  Marisue Ivan, PA-C  Cadence Nellie, [place]  Gillian Shields, NP

## 2021-01-09 DIAGNOSIS — E876 Hypokalemia: Secondary | ICD-10-CM | POA: Diagnosis not present

## 2021-01-09 DIAGNOSIS — Z0000 Encounter for general adult medical examination without abnormal findings: Secondary | ICD-10-CM | POA: Diagnosis not present

## 2021-01-09 DIAGNOSIS — R361 Hematospermia: Secondary | ICD-10-CM | POA: Diagnosis not present

## 2021-01-09 DIAGNOSIS — E782 Mixed hyperlipidemia: Secondary | ICD-10-CM | POA: Diagnosis not present

## 2021-01-09 DIAGNOSIS — Z125 Encounter for screening for malignant neoplasm of prostate: Secondary | ICD-10-CM | POA: Diagnosis not present

## 2021-01-09 DIAGNOSIS — Z23 Encounter for immunization: Secondary | ICD-10-CM | POA: Diagnosis not present

## 2021-01-09 DIAGNOSIS — I10 Essential (primary) hypertension: Secondary | ICD-10-CM | POA: Diagnosis not present

## 2021-02-26 DIAGNOSIS — B349 Viral infection, unspecified: Secondary | ICD-10-CM | POA: Diagnosis not present

## 2021-04-03 DIAGNOSIS — R361 Hematospermia: Secondary | ICD-10-CM | POA: Diagnosis not present

## 2021-05-05 NOTE — Progress Notes (Deleted)
Cardiology Office Note    Date:  05/05/2021   ID:  Jerry Delgado 09-03-1956, MRN [id number]  PCP:  Dione Housekeeper, MD  Cardiologist:  Lorine Bears, MD  Electrophysiologist:  None   Chief Complaint: Follow-up  History of Present Illness:   Jerry Delgado is a 65 y.o. male with history of Afib diagnosed in early 07/2018 on Eliquis, HTN, HLD, and vasovagal syncope who presents for follow up of his Afib.   He was admitted in 07/2018 following a syncopal episode associated with a bowel movement. He was noted to be in new onset Afib with reasonably controlled ventricular rates. Echo on 08/05/2018 showed an EF of 60-65%, normal wall motion, no significant valvular abnormalities. Cardiac enzymes negative x 2, BNP 170, TSH normal, magnesium at goal, potassium 3.5. Lexiscan Myoview on 08/06/2018 was negative for significant ischemia. He remained in Afib with well controlled ventricular rates. His amlodipine was changed to diltiazem, he was continued on bisoprolol and was started on Eliquis in place of ASA. In hospital follow up later in 07/2018, he was doing well and was noted to have spontaneously converted to sinus rhythm. He had noted some scleral erythema that had resolved without intervention. He was tolerating Eliquis. He was bradycardic with heart rates in the 40s to 50s bpm, leading his diltiazem to be held.  He was seen in the office in 08/2018 and was doing well from a cardiac perspective.  He was tolerating Eliquis and bisoprolol without issue.  Given a CHA2DS2-VASc of 1 (HTN) and no further evidence of A. Fib, Eliquis was discontinued at that time.  Amlodipine was titrated to 5 mg twice daily and he was continued on benazepril along with Ziac.  He was seen in the office in 04/2020 and was doing well from a cardiac perspective. He was walking 5-6 miles per day without cardiac limitation.  I last saw him in 10/2020 for routine follow-up, at which time he was doing well from a cardiac  perspective.  His BP was mildly elevated in the office though reasonably controlled at home.  With noted LDL of 170, he underwent a trial of Crestor 5 mg 3 days/week.  Repeat lipid panel showed improved LDL of 107 as outlined below.  ***   Labs independently reviewed: 12/2020 - potassium 3.7, BUN 8, serum creatinine 1.0, albumin 4.2, AST/ALT normal, TC 188, TG 101, HDL 61, LDL 107 07/2018 - Hgb 15.3, PLT 228, A1c 4.7, TSH normal  Past Medical History:  Diagnosis Date   History of echocardiogram    a. TTE 10/19: EF 60-65%, no RWMA   Hypertension    PAF (paroxysmal atrial fibrillation) (HCC)    a. diagnosed 07/2018; b. CHADS2VASc 1 (age); c. Eliquis    Past Surgical History:  Procedure Laterality Date   HERNIA REPAIR     shattered heel  2012    Current Medications: No outpatient medications have been marked as taking for the 05/10/21 encounter (Appointment) with Sondra Barges, PA-C.    Allergies:   Codeine   Social History   Socioeconomic History   Marital status: Married    Spouse name: Not on file   Number of children: Not on file   Years of education: Not on file   Highest education level: Not on file  Occupational History   Not on file  Tobacco Use   Smoking status: Never   Smokeless tobacco: Never  Substance and Sexual Activity   Alcohol use: Never  Drug use: Never   Sexual activity: Not on file  Other Topics Concern   Not on file  Social History Narrative   Not on file   Social Determinants of Health   Financial Resource Strain: Not on file  Food Insecurity: Not on file  Transportation Needs: Not on file  Physical Activity: Not on file  Stress: Not on file  Social Connections: Not on file     Family History:  The patient's family history includes Hypertension in his father.  ROS:   ROS   EKGs/Labs/Other Studies Reviewed:    Studies reviewed were summarized above. The additional studies were reviewed today:  2D echo 07/2018: - Left ventricle:  The cavity size was normal. There was mild    concentric hypertrophy. Systolic function was normal. The    estimated ejection fraction was in the range of 60% to 65%. Wall    motion was normal; there were no regional wall motion    abnormalities.  - Pulmonary arteries: Systolic pressure could not be accurately    estimated.  __________   Eugenie Birks MPI 07/2018: T wave inversion was noted during stress in the II and III leads. There was no ST segment deviation noted during stress. The study is normal. This is a low risk study. The left ventricular ejection fraction is normal (55-65%).   EKG:  EKG is ordered today.  The EKG ordered today demonstrates ***  Recent Labs: 11/22/2020: ALT 19  Recent Lipid Panel    Component Value Date/Time   CHOL 249 (H) 11/22/2020 0909   CHOL 201 (H) 09/17/2018 0829   TRIG 85 11/22/2020 0909   HDL 62 11/22/2020 0909   HDL 52 09/17/2018 0829   CHOLHDL 4.0 11/22/2020 0909   VLDL 17 11/22/2020 0909   LDLCALC 170 (H) 11/22/2020 0909   LDLCALC 135 (H) 09/17/2018 0829    PHYSICAL EXAM:    VS:  There were no vitals taken for this visit.  BMI: There is no height or weight on file to calculate BMI.  Physical Exam  Wt Readings from Last 3 Encounters:  11/24/20 176 lb (79.8 kg)  05/02/20 171 lb (77.6 kg)  09/17/18 172 lb 12 oz (78.4 kg)     ASSESSMENT & PLAN:   Persistent A. fib: ***.  CHADS2VASc 1 (HTN).  ***  HTN: Blood pressure ***  HLD: LDL 107 with normal LFT in 12/2020.  Disposition: F/u with Dr. Kirke Corin or an APP in ***.   Medication Adjustments/Labs and Tests Ordered: Current medicines are reviewed at length with the patient today.  Concerns regarding medicines are outlined above. Medication changes, Labs and Tests ordered today are summarized above and listed in the Patient Instructions accessible in Encounters.   Signed, Eula Listen, PA-C 05/05/2021 10:27 AM     [facility] [place] [address] [place],  [place] [address] [phone]

## 2021-05-10 ENCOUNTER — Ambulatory Visit: Payer: BC Managed Care – PPO | Admitting: Physician Assistant

## 2021-05-16 DIAGNOSIS — D225 Melanocytic nevi of trunk: Secondary | ICD-10-CM | POA: Diagnosis not present

## 2021-05-16 DIAGNOSIS — B079 Viral wart, unspecified: Secondary | ICD-10-CM | POA: Diagnosis not present

## 2021-05-16 DIAGNOSIS — L814 Other melanin hyperpigmentation: Secondary | ICD-10-CM | POA: Diagnosis not present

## 2021-05-21 DIAGNOSIS — Z23 Encounter for immunization: Secondary | ICD-10-CM | POA: Diagnosis not present

## 2021-05-31 NOTE — Progress Notes (Signed)
Cardiology Office Note    Date:  06/07/2021   ID:  Jerry Delgado 24-Dec-1955, MRN [id number]  PCP:  Dione Housekeeper, MD  Cardiologist:  Lorine Bears, MD  Electrophysiologist:  None   Chief Complaint: Follow up  History of Present Illness:   Jerry Delgado is a 65 y.o. male with history of persistent Afib diagnosed in early 07/2018, HTN, HLD, and vasovagal syncope who presents for follow up of his Afib.   Prior to his hospital admission in 07/2018, he did not have any previously known cardiac history. He did note intermittent palpitations for several months leading up to his admission. He was admitted in 07/2018 following a syncopal episode associated with a bowel movement. He was noted to be in new onset Afib with reasonably controlled ventricular rates. Echo on 08/05/2018 showed an EF of 60-65%, normal wall motion, no significant valvular abnormalities. Cardiac enzymes negative x 2, BNP 170, TSH normal, magnesium at goal, potassium 3.5. Lexiscan Myoview on 08/06/2018 was negative for significant ischemia. He remained in Afib with well controlled ventricular rates. His amlodipine was changed to diltiazem, he was continued on bisoprolol and was started on Eliquis in place of ASA. In hospital follow up later in 07/2018, he was doing well and was noted to have spontaneously converted to sinus rhythm. He had noted some scleral erythema that had resolved without intervention. He was tolerating Eliquis. He was bradycardic with heart rates in the 40s to 50s bpm, leading his diltiazem to be held.  He was seen in the office in 08/2018 and was doing well from a cardiac perspective.  He was tolerating Eliquis and bisoprolol without issue.  Given a CHA2DS2-VASc of 1 (HTN) and no further evidence of A. Fib, Eliquis was discontinued at that time.  Amlodipine was titrated to 5 mg twice daily and he was continued on benazepril along with Ziac.  He was last seen in the office in 10/2020 and was doing  well from a cardiac perspective.  There were no symptoms concerning for recurrence of A. fib.  He was started on Crestor.  He comes in doing well from a cardiac perspective.  No chest pain, dyspnea, dizziness, presyncope, or syncope.  He has noted approximately 6 episodes of brief possible palpitations that have not felt like his prior episode of A. fib and will typically last for 1 minute.  No lower extremity swelling or abdominal distention.  He is tolerating all medications without issues.  Home BP has been running in the 120s to 130s systolic.  Overall, he is doing well and does not have any issues or concerns at this time.     Labs independently reviewed: 12/2020 - potassium 3.7, BUN 8, serum creatinine 1.0, albumin 4.2, AST/ALT normal, TC 188, TG 101, LDL 107, HDL 61 07/2018 - Hgb 15.3, PLT 228, A1c 4.7, magnesium 3.1, TSH normal    Past Medical History:  Diagnosis Date   History of echocardiogram    a. TTE 10/19: EF 60-65%, no RWMA   Hypertension    PAF (paroxysmal atrial fibrillation) (HCC)    a. diagnosed 07/2018; b. CHADS2VASc 1 (age); c. Eliquis    Past Surgical History:  Procedure Laterality Date   HERNIA REPAIR     shattered heel  2012    Current Medications: Current Meds  Medication Sig   amLODipine (NORVASC) 10 MG tablet Take 10 mg by mouth daily.   aspirin EC 81 MG tablet Take 1 tablet (81 mg total) by  mouth daily.   benazepril (LOTENSIN) 40 MG tablet Take 1 tablet (40 mg total) by mouth daily.   bisoprolol-hydrochlorothiazide (ZIAC) 5-6.25 MG tablet Take 1 tablet by mouth daily.   Multiple Vitamin (MULTIVITAMIN WITH MINERALS) TABS tablet Take 1 tablet by mouth daily.   Potassium Chloride ER 20 MEQ TBCR Take 1 tablet by mouth 3 (three) times daily.   Turmeric 500 MG CAPS Take 1 capsule by mouth daily.   vitamin C (ASCORBIC ACID) 500 MG tablet Take 1,000 mg by mouth daily.    [DISCONTINUED] rosuvastatin (CRESTOR) 5 MG tablet Take 1 tablet (5 mg total) by mouth  daily.    Allergies:   Codeine   Social History   Socioeconomic History   Marital status: Married    Spouse name: Not on file   Number of children: Not on file   Years of education: Not on file   Highest education level: Not on file  Occupational History   Not on file  Tobacco Use   Smoking status: Never   Smokeless tobacco: Never  Substance and Sexual Activity   Alcohol use: Never   Drug use: Never   Sexual activity: Not on file  Other Topics Concern   Not on file  Social History Narrative   Not on file   Social Determinants of Health   Financial Resource Strain: Not on file  Food Insecurity: Not on file  Transportation Needs: Not on file  Physical Activity: Not on file  Stress: Not on file  Social Connections: Not on file     Family History:  The patient's family history includes Hypertension in his father.  ROS:   Review of Systems  Constitutional:  Negative for chills, diaphoresis, fever, malaise/fatigue and weight loss.  HENT:  Negative for congestion.   Eyes:  Negative for discharge and redness.  Respiratory:  Negative for cough, sputum production, shortness of breath and wheezing.   Cardiovascular:  Positive for palpitations. Negative for chest pain, orthopnea, claudication, leg swelling and PND.       Sporadic possible palpitations lasting approximately 60 seconds in duration  Gastrointestinal:  Negative for abdominal pain, heartburn, nausea and vomiting.  Musculoskeletal:  Negative for falls and myalgias.  Skin:  Negative for rash.  Neurological:  Negative for dizziness, tingling, tremors, sensory change, speech change, focal weakness, loss of consciousness and weakness.  Endo/Heme/Allergies:  Does not bruise/bleed easily.  Psychiatric/Behavioral:  Negative for substance abuse. The patient is not nervous/anxious.   All other systems reviewed and are negative.   EKGs/Labs/Other Studies Reviewed:    Studies reviewed were summarized above. The  additional studies were reviewed today:  2D echo 07/2018: - Left ventricle: The cavity size was normal. There was mild    concentric hypertrophy. Systolic function was normal. The    estimated ejection fraction was in the range of 60% to 65%. Wall    motion was normal; there were no regional wall motion    abnormalities.  - Pulmonary arteries: Systolic pressure could not be accurately    estimated.  __________   Eugenie BirksLexiscan MPI 07/2018: T wave inversion was noted during stress in the II and III leads. There was no ST segment deviation noted during stress. The study is normal. This is a low risk study. The left ventricular ejection fraction is normal (55-65%).   EKG:  EKG is ordered today.  The EKG ordered today demonstrates sinus bradycardia, 58 bpm, LVH, no acute ST-T changes, when compared to prior tracing no significant changes  Recent Labs: 11/22/2020: ALT 19  Recent Lipid Panel    Component Value Date/Time   CHOL 249 (H) 11/22/2020 0909   CHOL 201 (H) 09/17/2018 0829   TRIG 85 11/22/2020 0909   HDL 62 11/22/2020 0909   HDL 52 09/17/2018 0829   CHOLHDL 4.0 11/22/2020 0909   VLDL 17 11/22/2020 0909   LDLCALC 170 (H) 11/22/2020 0909   LDLCALC 135 (H) 09/17/2018 0829    PHYSICAL EXAM:    VS:  BP (!) 148/80 (BP Location: Left Arm, Patient Position: Sitting, Cuff Size: Normal)   Pulse (!) 58   Ht 5\' 8"  (1.727 m)   Wt 176 lb 4 oz (79.9 kg)   SpO2 98%   BMI 26.80 kg/m   BMI: Body mass index is 26.8 kg/m.  Physical Exam Vitals reviewed.  Constitutional:      Appearance: He is well-developed.  HENT:     Head: Normocephalic and atraumatic.  Eyes:     General:        Right eye: No discharge.        Left eye: No discharge.  Neck:     Vascular: No JVD.  Cardiovascular:     Rate and Rhythm: Regular rhythm. Bradycardia present.     Pulses:          Posterior tibial pulses are 2+ on the right side and 2+ on the left side.     Heart sounds: Normal heart sounds, S1  normal and S2 normal. Heart sounds not distant. No midsystolic click and no opening snap. No murmur heard.   No friction rub.  Pulmonary:     Effort: Pulmonary effort is normal. No respiratory distress.     Breath sounds: Normal breath sounds. No decreased breath sounds, wheezing or rales.  Chest:     Chest wall: No tenderness.  Abdominal:     General: There is no distension.     Palpations: Abdomen is soft.     Tenderness: There is no abdominal tenderness.  Musculoskeletal:     Cervical back: Normal range of motion.     Right lower leg: No edema.     Left lower leg: No edema.  Skin:    General: Skin is warm and dry.     Nails: There is no clubbing.  Neurological:     Mental Status: He is alert and oriented to person, place, and time.  Psychiatric:        Speech: Speech normal.        Behavior: Behavior normal.        Thought Content: Thought content normal.        Judgment: Judgment normal.    Wt Readings from Last 3 Encounters:  06/07/21 176 lb 4 oz (79.9 kg)  11/24/20 176 lb (79.8 kg)  05/02/20 171 lb (77.6 kg)     ASSESSMENT & PLAN:   Persistent A. fib: No symptoms concerning for recurrence of A. fib.  He has noted approximately 6 brief episodes of possible palpitations that occurred since he has last been seen, though these did not feel like his prior episode of A. fib.  Given brief and sporadic episodes we have agreed to defer outpatient cardiac monitoring at this time.  Should symptoms become more frequent or longer lasting would recommend Zio patch monitoring, particularly as he is approaching his 65th birthday and in that setting his CHA2DS2-VASc will increase from 1 to 2.  For now, without documented evidence of A. fib recurrence we will continue  to defer reinitiation of OAC.  He remains on bisoprolol as outlined below.  He is asymptomatic with mildly bradycardic heart rates.  HTN: Blood pressure is mildly elevated in the office though has been well controlled at home.   There is some component of whitecoat hypertension.  Given this no medication changes were made today and he will continue current doses of benazepril, Ziac, and amlodipine.  HLD: LDL improved from 170-107 on rosuvastatin 5 mg Monday, Wednesday, and Friday, which will be continued.  Goal LDL less than 100 at this time.  When he is seen in follow-up trend LFT and lipid panel.  Disposition: F/u with Dr. Kirke Corin or an APP in 6 months, sooner if needed.   Medication Adjustments/Labs and Tests Ordered: Current medicines are reviewed at length with the patient today.  Concerns regarding medicines are outlined above. Medication changes, Labs and Tests ordered today are summarized above and listed in the Patient Instructions accessible in Encounters.   Signed, Eula Listen, PA-C 06/07/2021 2:52 PM     [facility] [address] [place], [place] [address] [phone]

## 2021-06-07 ENCOUNTER — Other Ambulatory Visit: Payer: Self-pay

## 2021-06-07 ENCOUNTER — Ambulatory Visit (INDEPENDENT_AMBULATORY_CARE_PROVIDER_SITE_OTHER): Payer: BC Managed Care – PPO | Admitting: Physician Assistant

## 2021-06-07 ENCOUNTER — Encounter: Payer: Self-pay | Admitting: Physician Assistant

## 2021-06-07 VITALS — BP 148/80 | HR 58 | Ht 68.0 in | Wt 176.2 lb

## 2021-06-07 DIAGNOSIS — I10 Essential (primary) hypertension: Secondary | ICD-10-CM

## 2021-06-07 DIAGNOSIS — I4819 Other persistent atrial fibrillation: Secondary | ICD-10-CM | POA: Diagnosis not present

## 2021-06-07 DIAGNOSIS — E785 Hyperlipidemia, unspecified: Secondary | ICD-10-CM | POA: Diagnosis not present

## 2021-06-07 MED ORDER — ROSUVASTATIN CALCIUM 5 MG PO TABS: 5.0000 mg | ORAL_TABLET | ORAL | 3 refills | Status: DC

## 2021-06-07 NOTE — Patient Instructions (Signed)
Medication Instructions:  Please take Crestor 5 mg on Monday, Wednesday, and Fridays  *If you need a refill on your cardiac medications before your next appointment, please call your pharmacy*   Lab Work: None  Testing/Procedures: None   Follow-Up: At [facility], you and your health needs are our priority.  As part of our continuing mission to provide you with exceptional heart care, we have created designated Provider Care Teams.  These Care Teams include your primary Cardiologist (physician) and Advanced Practice Providers (APPs -  Physician Assistants and Nurse Practitioners) who all work together to provide you with the care you need, when you need it.  Your next appointment:   6 month(s)  The format for your next appointment:   In Person  Provider:   Eula Listen, PA-C

## 2022-01-17 DIAGNOSIS — Z125 Encounter for screening for malignant neoplasm of prostate: Secondary | ICD-10-CM | POA: Diagnosis not present

## 2022-01-17 DIAGNOSIS — S90212D Contusion of left great toe with damage to nail, subsequent encounter: Secondary | ICD-10-CM | POA: Diagnosis not present

## 2022-01-17 DIAGNOSIS — Z0000 Encounter for general adult medical examination without abnormal findings: Secondary | ICD-10-CM | POA: Diagnosis not present

## 2022-01-17 DIAGNOSIS — I10 Essential (primary) hypertension: Secondary | ICD-10-CM | POA: Diagnosis not present

## 2022-01-17 DIAGNOSIS — E876 Hypokalemia: Secondary | ICD-10-CM | POA: Diagnosis not present

## 2022-01-17 DIAGNOSIS — E782 Mixed hyperlipidemia: Secondary | ICD-10-CM | POA: Diagnosis not present

## 2022-01-17 DIAGNOSIS — Z1331 Encounter for screening for depression: Secondary | ICD-10-CM | POA: Diagnosis not present

## 2022-02-19 DIAGNOSIS — Z1211 Encounter for screening for malignant neoplasm of colon: Secondary | ICD-10-CM | POA: Diagnosis not present

## 2022-04-08 ENCOUNTER — Encounter: Payer: Self-pay | Admitting: Physician Assistant

## 2022-04-08 ENCOUNTER — Ambulatory Visit: Payer: BC Managed Care – PPO | Admitting: Physician Assistant

## 2022-04-08 VITALS — BP 160/88 | HR 54 | Ht 68.0 in | Wt 177.5 lb

## 2022-04-08 DIAGNOSIS — I4819 Other persistent atrial fibrillation: Secondary | ICD-10-CM

## 2022-04-08 DIAGNOSIS — E785 Hyperlipidemia, unspecified: Secondary | ICD-10-CM | POA: Diagnosis not present

## 2022-04-08 DIAGNOSIS — I10 Essential (primary) hypertension: Secondary | ICD-10-CM

## 2022-04-08 NOTE — Patient Instructions (Signed)
Medication Instructions:  No changes at this time.   *If you need a refill on your cardiac medications before your next appointment, please call your pharmacy*   Lab Work: None  If you have labs (blood work) drawn today and your tests are completely normal, you will receive your results only by: [place] (if you have MyChart) OR A paper copy in the mail If you have any lab test that is abnormal or we need to change your treatment, we will call you to review the results.   Testing/Procedures: None   Follow-Up: At [facility], you and your health needs are our priority.  As part of our continuing mission to provide you with exceptional heart care, we have created designated Provider Care Teams.  These Care Teams include your primary Cardiologist (physician) and Advanced Practice Providers (APPs -  Physician Assistants and Nurse Practitioners) who all work together to provide you with the care you need, when you need it.   Your next appointment:   1 year(s)  The format for your next appointment:   In Person  Provider:   Kathlyn Sacramento, MD or Christell Faith, PA-C        Important Information About Sugar

## 2022-04-08 NOTE — Progress Notes (Signed)
Cardiology Office Note    Date:  04/08/2022   ID:  Jerry Delgado 11/20/1955, MRN [id number]  PCP:  Dione Housekeeper, MD  Cardiologist:  Lorine Bears, MD  Electrophysiologist:  None   Chief Complaint: Follow-up  History of Present Illness:   Jerry Delgado is a 66 y.o. male with history of persistent Afib diagnosed in early 07/2018, HTN, HLD, and vasovagal syncope who presents for follow up of his Afib.   Prior to his hospital admission in 07/2018, he did not have any previously known cardiac history. He did note intermittent palpitations for several months leading up to his admission. He was admitted in 07/2018 following a syncopal episode associated with a bowel movement. He was noted to be in new onset Afib with reasonably controlled ventricular rates. Echo on 08/05/2018 showed an EF of 60-65%, normal wall motion, no significant valvular abnormalities. Cardiac enzymes negative x 2, BNP 170, TSH normal, magnesium at goal, potassium 3.5. Lexiscan Myoview on 08/06/2018 was negative for significant ischemia. He remained in Afib with well controlled ventricular rates. His amlodipine was changed to diltiazem, he was continued on bisoprolol and was started on Eliquis in place of ASA. In hospital follow up later in 07/2018, he was doing well and was noted to have spontaneously converted to sinus rhythm. He had noted some scleral erythema that had resolved without intervention. He was tolerating Eliquis. He was bradycardic with heart rates in the 40s to 50s bpm, leading his diltiazem to be held.  He was seen in the office in 08/2018 and was doing well from a cardiac perspective.  He was tolerating Eliquis and bisoprolol without issue.  Given a CHA2DS2-VASc of 1 (HTN) and no further evidence of A. Fib, Eliquis was discontinued at that time.  Amlodipine was titrated to 5 mg twice daily and he was continued on benazepril along with Ziac.  He was seen in the office in 10/2020 and was doing well  from a cardiac perspective.  There were no symptoms concerning for recurrence of A. fib.  He was started on Crestor.  He was last seen in the office in 05/2021 and was without symptoms of angina or decompensation.  He did report several brief episodes of palpitations that did not feel like his prior episode of A-fib, and would typically last for 1 minute.  Given rarity of symptoms, outpatient cardiac monitoring was deferred.  He comes in doing well from a cardiac perspective and is without symptoms of angina or decompensation.  He has not had any further episodes of palpitations.  He remains active at baseline, typically walking 3 to 5 miles per day without symptoms of claudication.  He does note his legs are sore if he does not remain active.  Blood pressure readings at home are well controlled with most readings in the 120s to 130s with a range of 124-144/74-80.  Heart rates are typically in the 50s bpm.  He does not have any active cardiac issues or concerns at this time.   Labs independently reviewed: 12/2021 - TC 154, TG 85, HDL 55, LDL 82, potassium 3.1, BUN 8, serum creatinine 0.9, albumin 4.2, AST/ALT normal 07/2018 - Hgb 15.3, PLT 228, A1c 4.7, magnesium 3.1, TSH normal  Past Medical History:  Diagnosis Date   History of echocardiogram    a. TTE 10/19: EF 60-65%, no RWMA   Hypertension    PAF (paroxysmal atrial fibrillation) (HCC)    a. diagnosed 07/2018; b. CHADS2VASc 1 (age); c.  Eliquis    Past Surgical History:  Procedure Laterality Date   HERNIA REPAIR     shattered heel  2012    Current Medications: Current Meds  Medication Sig   amLODipine (NORVASC) 10 MG tablet Take 10 mg by mouth daily.   aspirin EC 81 MG tablet Take 1 tablet (81 mg total) by mouth daily.   benazepril (LOTENSIN) 40 MG tablet Take 1 tablet (40 mg total) by mouth daily.   bisoprolol-hydrochlorothiazide (ZIAC) 5-6.25 MG tablet Take 1 tablet by mouth daily.   Multiple Vitamin (MULTIVITAMIN WITH MINERALS)  TABS tablet Take 1 tablet by mouth daily.   Potassium Chloride ER 20 MEQ TBCR Take 1 tablet by mouth 3 (three) times daily.   rosuvastatin (CRESTOR) 5 MG tablet Take 1 tablet (5 mg total) by mouth every Monday, Wednesday, and Friday.   Turmeric 500 MG CAPS Take 1 capsule by mouth daily.   vitamin C (ASCORBIC ACID) 500 MG tablet Take 1,000 mg by mouth daily.     Allergies:   Codeine   Social History   Socioeconomic History   Marital status: Married    Spouse name: Not on file   Number of children: Not on file   Years of education: Not on file   Highest education level: Not on file  Occupational History   Not on file  Tobacco Use   Smoking status: Never   Smokeless tobacco: Never  Vaping Use   Vaping Use: Never used  Substance and Sexual Activity   Alcohol use: Never   Drug use: Never   Sexual activity: Not on file  Other Topics Concern   Not on file  Social History Narrative   Not on file   Social Determinants of Health   Financial Resource Strain: Not on file  Food Insecurity: Not on file  Transportation Needs: Not on file  Physical Activity: Not on file  Stress: Not on file  Social Connections: Not on file     Family History:  The patient's family history includes Hypertension in his father.  ROS:   12-point review of systems is negative unless otherwise noted in the HPI.   EKGs/Labs/Other Studies Reviewed:    Studies reviewed were summarized above. The additional studies were reviewed today:  2D echo 07/2018: - Left ventricle: The cavity size was normal. There was mild    concentric hypertrophy. Systolic function was normal. The    estimated ejection fraction was in the range of 60% to 65%. Wall    motion was normal; there were no regional wall motion    abnormalities.  - Pulmonary arteries: Systolic pressure could not be accurately    estimated.  __________   Eugenie Birks MPI 07/2018: T wave inversion was noted during stress in the II and III  leads. There was no ST segment deviation noted during stress. The study is normal. This is a low risk study. The left ventricular ejection fraction is normal (55-65%).   EKG:  EKG is ordered today.  The EKG ordered today demonstrates sinus bradycardia, baseline wandering and artifact, no acute ST-T changes  Recent Labs: No results found for requested labs within last 365 days.  Recent Lipid Panel    Component Value Date/Time   CHOL 249 (H) 11/22/2020 0909   CHOL 201 (H) 09/17/2018 0829   TRIG 85 11/22/2020 0909   HDL 62 11/22/2020 0909   HDL 52 09/17/2018 0829   CHOLHDL 4.0 11/22/2020 0909   VLDL 17 11/22/2020 0909   LDLCALC  170 (H) 11/22/2020 0909   LDLCALC 135 (H) 09/17/2018 0829    PHYSICAL EXAM:    VS:  BP (!) 160/88 (BP Location: Left Arm, Patient Position: Sitting, Cuff Size: Normal)   Pulse (!) 54   Ht 5\' 8"  (1.727 m)   Wt 177 lb 8 oz (80.5 kg)   SpO2 98%   BMI 26.99 kg/m   BMI: Body mass index is 26.99 kg/m.  Physical Exam Constitutional:      Appearance: He is well-developed.  HENT:     Head: Normocephalic and atraumatic.  Eyes:     General:        Right eye: No discharge.        Left eye: No discharge.  Cardiovascular:     Rate and Rhythm: Regular rhythm. Bradycardia present.     Pulses:          Posterior tibial pulses are 2+ on the right side and 2+ on the left side.     Heart sounds: Normal heart sounds, S1 normal and S2 normal. Heart sounds not distant. No midsystolic click and no opening snap. No murmur heard.    No friction rub.  Pulmonary:     Effort: Pulmonary effort is normal. No respiratory distress.     Breath sounds: Normal breath sounds. No decreased breath sounds, wheezing or rales.  Chest:     Chest wall: No tenderness.  Abdominal:     General: There is no distension.     Palpations: Abdomen is soft.  Musculoskeletal:     Cervical back: Normal range of motion.     Right lower leg: No edema.     Left lower leg: No edema.  Skin:     General: Skin is warm and dry.     Nails: There is no clubbing.  Neurological:     Mental Status: He is alert and oriented to person, place, and time.  Psychiatric:        Speech: Speech normal.        Behavior: Behavior normal.        Thought Content: Thought content normal.        Judgment: Judgment normal.     Wt Readings from Last 3 Encounters:  04/08/22 177 lb 8 oz (80.5 kg)  06/07/21 176 lb 4 oz (79.9 kg)  11/24/20 176 lb (79.8 kg)     ASSESSMENT & PLAN:   Persistent A-fib: No symptoms concerning for recurrence.  Given isolated episode, and without evidence of recurrent arrhythmia, he remains off anticoagulation.  CHA2DS2-VASc 2 (HTN, age x 1).  Should he have documented recurrence of A-fib, we would need to revisit anticoagulation as his CHA2DS2-VASc is now 2.  Continue low-dose bisoprolol.  Bradycardia precludes further titration of beta-blocker.  He is asymptomatic with mildly bradycardic heart rates.  HTN: Blood pressure is elevated at triage at 160/88.  However, BP has been well controlled at home.  Blood pressure is typically higher and medical practices.  No changes were made.  He remains on amlodipine, benazepril, bisoprolol/HCTZ.  HLD: LDL 82.  He remains on rosuvastatin.    Disposition: F/u with Dr. Kirke CorinArida or an APP in 12 months.   Medication Adjustments/Labs and Tests Ordered: Current medicines are reviewed at length with the patient today.  Concerns regarding medicines are outlined above. Medication changes, Labs and Tests ordered today are summarized above and listed in the Patient Instructions accessible in Encounters.   Signed, Eula Listenyan Corrigan Kretschmer, PA-C 04/08/2022 2:23 PM  [place], [place] [address] [phone]

## 2022-07-28 ENCOUNTER — Other Ambulatory Visit: Payer: Self-pay | Admitting: Physician Assistant

## 2023-04-29 NOTE — Progress Notes (Signed)
Cardiology Office Note    Date:  05/02/2023   ID:  Jerry Delgado, Jerry Delgado Apr 12, 1956, [name]  PCP:  Dione Housekeeper, MD  Cardiologist:  Lorine Bears, MD  Electrophysiologist:  None   Chief Complaint: Follow-up  History of Present Illness:   Jerry Delgado is a 67 y.o. male with history of persistent Afib diagnosed in early 07/2018, HTN, HLD, and vasovagal syncope who presents for follow up of his Afib.   Prior to his hospital admission in 07/2018, he did not have any previously known cardiac history. He did note intermittent palpitations for several months leading up to his admission. He was admitted in 07/2018 following a syncopal episode associated with a bowel movement. He was noted to be in new onset Afib with reasonably controlled ventricular rates. Echo on 08/05/2018 showed an EF of 60-65%, normal wall motion, no significant valvular abnormalities.  Lexiscan Myoview on 08/06/2018 was negative for significant ischemia. He remained in Afib with well controlled ventricular rates. His amlodipine was changed to diltiazem, he was continued on bisoprolol and was started on Eliquis in place of ASA. In hospital follow up later in 07/2018, he was doing well and was noted to have spontaneously converted to sinus rhythm. He had noted some scleral erythema that had resolved without intervention. He was tolerating Eliquis. He was bradycardic with heart rates in the 40s to 50s bpm, leading his diltiazem to be held.  He was seen in the office in 08/2018 and was doing well from a cardiac perspective.  He was tolerating Eliquis and bisoprolol without issue.  Given a CHA2DS2-VASc of 1 (HTN), and no further evidence of A. Fib, Eliquis was discontinued at that time.  He was last seen in the office in 03/2022, was maintaining sinus rhythm, and was without symptoms of angina or cardiac decompensation.  No symptoms concerning for A-fib recurrence.  He remained active at baseline.  He comes in doing very  well from a cardiac perspective and is without symptoms of angina or cardiac decompensation.  No palpitations, dyspnea, dizziness, presyncope, or syncope.  He does note some mild lower extremity swelling if he stands in 1 place for an extended timeframe, otherwise is without lower extremity swelling or progressive orthopnea.  No falls or symptoms concerning for bleeding.  Blood pressure at home is typically in the 120s systolic.  Adherent and tolerating cardiac medications.  Able to demonstrate appropriate chronotropic competence with exercise.   Labs independently reviewed: 01/2023 - potassium 3.3, BUN 7, serum creatinine 1.0, albumin 4.1, AST/ALT normal, TC 189, TG 88, HDL 55, LDL 116 07/2018 - Hgb 15.3, PLT 228, A1c 4.7, magnesium 3.1, TSH normal  Past Medical History:  Diagnosis Date   History of echocardiogram    a. TTE 10/19: EF 60-65%, no RWMA   Hypertension    PAF (paroxysmal atrial fibrillation) (HCC)    a. diagnosed 07/2018; b. CHADS2VASc 1 (age); c. Eliquis    Past Surgical History:  Procedure Laterality Date   HERNIA REPAIR     shattered heel  2012    Current Medications: Current Meds  Medication Sig   amLODipine (NORVASC) 10 MG tablet Take 10 mg by mouth daily.   aspirin EC 81 MG tablet Take 1 tablet (81 mg total) by mouth daily.   benazepril (LOTENSIN) 40 MG tablet Take 1 tablet (40 mg total) by mouth daily.   bisoprolol-hydrochlorothiazide (ZIAC) 5-6.25 MG tablet Take 1 tablet by mouth daily.   Multiple Vitamin (MULTIVITAMIN WITH MINERALS)  TABS tablet Take 1 tablet by mouth daily.   Potassium Chloride ER 20 MEQ TBCR Take 1 tablet by mouth 3 (three) times daily.   rosuvastatin (CRESTOR) 5 MG tablet Take 1 tablet (5 mg total) by mouth every Monday, Wednesday, and Friday.   Turmeric 500 MG CAPS Take 1 capsule by mouth daily.   vitamin C (ASCORBIC ACID) 500 MG tablet Take 1,000 mg by mouth daily.     Allergies:   Codeine   Social History   Socioeconomic History    Marital status: Married    Spouse name: Not on file   Number of children: Not on file   Years of education: Not on file   Highest education level: Not on file  Occupational History   Not on file  Tobacco Use   Smoking status: Never   Smokeless tobacco: Never  Vaping Use   Vaping Use: Never used  Substance and Sexual Activity   Alcohol use: Never   Drug use: Never   Sexual activity: Not on file  Other Topics Concern   Not on file  Social History Narrative   Not on file   Social Determinants of Health   Financial Resource Strain: Not on file  Food Insecurity: Not on file  Transportation Needs: Not on file  Physical Activity: Not on file  Stress: Not on file  Social Connections: Not on file     Family History:  The patient's family history includes Hypertension in his father.  ROS:   12-point review of systems is negative unless otherwise noted in the HPI.   EKGs/Labs/Other Studies Reviewed:    Studies reviewed were summarized above. The additional studies were reviewed today:  2D echo 07/2018: - Left ventricle: The cavity size was normal. There was mild    concentric hypertrophy. Systolic function was normal. The    estimated ejection fraction was in the range of 60% to 65%. Wall    motion was normal; there were no regional wall motion    abnormalities.  - Pulmonary arteries: Systolic pressure could not be accurately    estimated.  __________   Eugenie Birks MPI 07/2018: T wave inversion was noted during stress in the II and III leads. There was no ST segment deviation noted during stress. The study is normal. This is a low risk study. The left ventricular ejection fraction is normal (55-65%).    EKG:  EKG is ordered today.  The EKG ordered today demonstrates sinus bradycardia, 56 bpm, no acute ST-T changes, consistent with prior tracing  Recent Labs: No results found for requested labs within last 365 days.  Recent Lipid Panel    Component Value Date/Time    CHOL 249 (H) 11/22/2020 0909   CHOL 201 (H) 09/17/2018 0829   TRIG 85 11/22/2020 0909   HDL 62 11/22/2020 0909   HDL 52 09/17/2018 0829   CHOLHDL 4.0 11/22/2020 0909   VLDL 17 11/22/2020 0909   LDLCALC 170 (H) 11/22/2020 0909   LDLCALC 135 (H) 09/17/2018 0829    PHYSICAL EXAM:    VS:  BP (!) 142/86 (BP Location: Left Arm, Patient Position: Sitting, Cuff Size: Normal)   Pulse (!) 56   Ht 5\' 8"  (1.727 m)   Wt 177 lb 3.2 oz (80.4 kg)   SpO2 98%   BMI 26.94 kg/m   BMI: Body mass index is 26.94 kg/m.  Physical Exam Vitals reviewed.  Constitutional:      Appearance: He is well-developed.  HENT:  Head: Normocephalic and atraumatic.  Eyes:     General:        Right eye: No discharge.        Left eye: No discharge.  Neck:     Vascular: No JVD.  Cardiovascular:     Rate and Rhythm: Regular rhythm. Bradycardia present.     Pulses:          Posterior tibial pulses are 2+ on the right side and 2+ on the left side.     Heart sounds: Normal heart sounds, S1 normal and S2 normal. Heart sounds not distant. No midsystolic click and no opening snap. No murmur heard.    No friction rub.  Pulmonary:     Effort: Pulmonary effort is normal. No respiratory distress.     Breath sounds: Normal breath sounds. No decreased breath sounds, wheezing or rales.  Chest:     Chest wall: No tenderness.  Abdominal:     General: There is no distension.  Musculoskeletal:     Cervical back: Normal range of motion.     Right lower leg: No edema.     Left lower leg: No edema.  Skin:    General: Skin is warm and dry.     Nails: There is no clubbing.  Neurological:     Mental Status: He is alert and oriented to person, place, and time.  Psychiatric:        Speech: Speech normal.        Behavior: Behavior normal.        Thought Content: Thought content normal.        Judgment: Judgment normal.     Wt Readings from Last 3 Encounters:  05/02/23 177 lb 3.2 oz (80.4 kg)  04/08/22 177 lb 8 oz  (80.5 kg)  06/07/21 176 lb 4 oz (79.9 kg)     ASSESSMENT & PLAN:   Persistent A-fib: Maintaining sinus rhythm with a mildly bradycardic rate (asymptomatic).  Remains on bisoprolol.  Has demonstrated appropriate chronotropic competence.  CHA2DS2-VASc 2 (HTN, age x 1).  No longer on OAC.  Should he have documented recurrence of A-fib, we would need to revisit anticoagulation.  HTN: Blood pressure is mildly elevated in the office today, though improved from prior readings in medical practices.  Home BP in the 120s systolic.  He remains on amlodipine, benazepril, and Ziac.  Recent labs showed stable renal function and electrolytes.  HLD: LDL 116 in 01/2023.  Lifestyle modification.  Remains on Crestor 5 mg Mondays, Wednesdays, and Fridays.   Disposition: F/u with Dr. Kirke Corin or an APP in 12 months.   Medication Adjustments/Labs and Tests Ordered: Current medicines are reviewed at length with the patient today.  Concerns regarding medicines are outlined above. Medication changes, Labs and Tests ordered today are summarized above and listed in the Patient Instructions accessible in Encounters.   Signed, Eula Listen, PA-C 05/02/2023 3:39 PM     [facility] [place] [address] [place], [place] [address] [phone]

## 2023-05-02 ENCOUNTER — Ambulatory Visit: Payer: BC Managed Care – PPO | Attending: Physician Assistant | Admitting: Physician Assistant

## 2023-05-02 ENCOUNTER — Encounter: Payer: Self-pay | Admitting: Physician Assistant

## 2023-05-02 VITALS — BP 142/86 | HR 56 | Ht 68.0 in | Wt 177.2 lb

## 2023-05-02 DIAGNOSIS — I4819 Other persistent atrial fibrillation: Secondary | ICD-10-CM | POA: Diagnosis not present

## 2023-05-02 DIAGNOSIS — E785 Hyperlipidemia, unspecified: Secondary | ICD-10-CM

## 2023-05-02 DIAGNOSIS — I10 Essential (primary) hypertension: Secondary | ICD-10-CM

## 2023-05-02 NOTE — Patient Instructions (Signed)
Medication Instructions:  Your Physician recommend you continue on your current medication as directed.    *If you need a refill on your cardiac medications before your next appointment, please call your pharmacy*   Lab Work: No labs ordered today.   If you have labs (blood work) drawn today and your tests are completely normal, you will receive your results only by: MyChart Message (if you have MyChart) OR A paper copy in the mail If you have any lab test that is abnormal or we need to change your treatment, we will call you to review the results.   Testing/Procedures: No test ordered today.    Follow-Up: At [facility], you and your health needs are our priority.  As part of our continuing mission to provide you with exceptional heart care, we have created designated Provider Care Teams.  These Care Teams include your primary Cardiologist (physician) and Advanced Practice Providers (APPs -  Physician Assistants and Nurse Practitioners) who all work together to provide you with the care you need, when you need it.  We recommend signing up for the patient portal called "MyChart".  Sign up information is provided on this After Visit Summary.  MyChart is used to connect with patients for Virtual Visits (Telemedicine).  Patients are able to view lab/test results, encounter notes, upcoming appointments, etc.  Non-urgent messages can be sent to your provider as well.   To learn more about what you can do with MyChart, go to [email or web address].    Your next appointment:   12 month(s)  Provider:   You may see Lorine Bears, MD or one of the following Advanced Practice Providers on your designated Care Team:   Nicolasa Ducking, NP Eula Listen, PA-C Cadence Fransico Michael, PA-C Charlsie Quest, NP

## 2024-08-09 NOTE — Progress Notes (Unsigned)
 Cardiology Office Note    Date:  08/10/2024   ID:  Jerry Delgado 1956-10-15, MRN [id number]  PCP:  Eliverto Bette Hover, MD  Cardiologist:  Deatrice Cage, MD  Electrophysiologist:  None   Chief Complaint: Follow up  History of Present Illness:   Jerry Delgado is a 68 y.o. male with history of persistent A-fib diagnosed in early 07/2018, HTN, HLD, and vasovagal syncope who presents for follow-up of A-fib.  He was admitted in 07/2018 following a syncopal episode associated with a bowel movement. He was noted to be in new onset Afib with reasonably controlled ventricular rates. Echo on 08/05/2018 showed an EF of 60-65%, normal wall motion, no significant valvular abnormalities.  Lexiscan  Myoview  on 08/06/2018 was negative for significant ischemia. He remained in Afib with well controlled ventricular rates. His amlodipine  was changed to diltiazem , he was continued on bisoprolol  and was started on Eliquis  in place of ASA. In hospital follow up later in 07/2018, and had spontaneously converted to sinus rhythm. He was bradycardic with heart rates in the 40s to 50s bpm, leading his diltiazem  to be held.  He was seen in the office in 08/2018 and was tolerating Eliquis  and bisoprolol  without issue.  Given a CHA2DS2-VASc of 1 (HTN), and no further evidence of A. Fib, Eliquis  was discontinued at that time.  He was last seen in the office in 04/2023 and continued to do well from a cardiac perspective.  He was maintaining sinus rhythm.  No changes in cardiac pharmacotherapy were pursued at that time.  He comes in continuing to do well from a cardiac perspective and is without symptoms of angina or cardiac decompensation.  No palpitations, dizziness, presyncope, or syncope.  No lower extremity swelling or progressive orthopnea.  No falls, hematochezia, or melena.  He has reduced rosuvastatin  from 3 days/week to 2 days/week secondary to myalgias and arthralgias.  This has helped, though not led to  resolution of off target effect.  He has also transition aspirin  from daily to every other day due to nuisance bruising with active lifestyle.  Blood pressure has been well-controlled at home with readings largely in the mid 120s mmHg systolic over 60s to 70s with heart rates in the 50s to 60s bpm.  Overall feels well at this time.   Labs independently reviewed: 01/2023 - potassium 3.3, BUN 7, serum creatinine 1.0, albumin 4.1, AST/ALT normal, TC 189, TG 88, HDL 55, LDL 116 07/2018 - Hgb 15.3, PLT 228, A1c 4.7, magnesium  3.1, TSH normal  Past Medical History:  Diagnosis Date   History of echocardiogram    a. TTE 10/19: EF 60-65%, no RWMA   Hypertension    PAF (paroxysmal atrial fibrillation) (HCC)    a. diagnosed 07/2018; b. CHADS2VASc 1 (age); c. Eliquis     Past Surgical History:  Procedure Laterality Date   HERNIA REPAIR     shattered heel  2012    Current Medications: Current Meds  Medication Sig   amLODipine  (NORVASC ) 10 MG tablet Take 10 mg by mouth daily.   aspirin  EC 81 MG tablet Take 1 tablet (81 mg total) by mouth daily.   benazepril  (LOTENSIN ) 40 MG tablet Take 1 tablet (40 mg total) by mouth daily.   bisoprolol -hydrochlorothiazide  (ZIAC ) 5-6.25 MG tablet Take 1 tablet by mouth daily.   Multiple Vitamin (MULTIVITAMIN WITH MINERALS) TABS tablet Take 1 tablet by mouth daily.   Potassium Chloride  ER 20 MEQ TBCR Take 1 tablet by mouth 3 (three) times daily.  rosuvastatin  (CRESTOR ) 5 MG tablet Take 1 tablet (5 mg total) by mouth every Monday, Wednesday, and Friday.   Turmeric 500 MG CAPS Take 1 capsule by mouth daily.   vitamin C  (ASCORBIC ACID ) 500 MG tablet Take 1,000 mg by mouth daily.     Allergies:   Codeine   Social History   Socioeconomic History   Marital status: Married    Spouse name: Not on file   Number of children: Not on file   Years of education: Not on file   Highest education level: Not on file  Occupational History   Not on file  Tobacco Use    Smoking status: Never   Smokeless tobacco: Never  Vaping Use   Vaping status: Never Used  Substance and Sexual Activity   Alcohol use: Never   Drug use: Never   Sexual activity: Not on file  Other Topics Concern   Not on file  Social History Narrative   Not on file   Social Drivers of Health   Financial Resource Strain: Low Risk  (01/26/2024)   Received from [facility]   Overall Financial Resource Strain (CARDIA)    Difficulty of Paying Living Expenses: Not hard at all  Food Insecurity: No Food Insecurity (01/26/2024)   Received from [facility]   Hunger Vital Sign    Within the past 12 months, you worried that your food would run out before you got the money to buy more.: Never true    Within the past 12 months, the food you bought just didn't last and you didn't have money to get more.: Never true  Transportation Needs: No Transportation Needs (01/26/2024)   Received from [facility]    In the past 12 months, has lack of transportation kept you from medical appointments or from getting medications?: No    Lack of Transportation (Non-Medical): No  Physical Activity: Not on file  Stress: Not on file  Social Connections: Not on file     Family History:  The patient's family history includes Hypertension in his father.  ROS:   12-point review of systems is negative unless otherwise noted in the HPI.   EKGs/Labs/Other Studies Reviewed:    Studies reviewed were summarized above. The additional studies were reviewed today:  2D echo 07/2018: - Left ventricle: The cavity size was normal. There was mild    concentric hypertrophy. Systolic function was normal. The    estimated ejection fraction was in the range of 60% to 65%. Wall    motion was normal; there were no regional wall motion    abnormalities.  - Pulmonary arteries: Systolic pressure could not be accurately    estimated.  __________    Lexiscan  MPI 07/2018: T wave inversion was noted during stress in the II and III leads. There was no ST segment deviation noted during stress. The study is normal. This is a low risk study. The left ventricular ejection fraction is normal (55-65%).   EKG:  EKG is ordered today.  The EKG ordered today demonstrates sinus bradycardia, 56 bpm, baseline wander, no acute ST-T changes  Recent Labs: No results found for requested labs within last 365 days.  Recent Lipid Panel    Component Value Date/Time   CHOL 249 (H) 11/22/2020 0909   CHOL 201 (H) 09/17/2018 0829   TRIG 85 11/22/2020 0909   HDL 62 11/22/2020 0909   HDL 52 09/17/2018 0829   CHOLHDL  4.0 11/22/2020 0909   VLDL 17 11/22/2020 0909   LDLCALC 170 (H) 11/22/2020 0909   LDLCALC 135 (H) 09/17/2018 0829    PHYSICAL EXAM:    VS:  BP (!) 148/90 (BP Location: Left Arm, Patient Position: Sitting, Cuff Size: Large)   Pulse (!) 56   Ht 5' 8 (1.727 m)   Wt 177 lb (80.3 kg)   SpO2 99%   BMI 26.91 kg/m   BMI: Body mass index is 26.91 kg/m.  Physical Exam Vitals reviewed.  Constitutional:      Appearance: He is well-developed.  HENT:     Head: Normocephalic and atraumatic.  Eyes:     General:        Right eye: No discharge.        Left eye: No discharge.  Cardiovascular:     Rate and Rhythm: Normal rate and regular rhythm.     Pulses:          Posterior tibial pulses are 2+ on the right side and 2+ on the left side.     Heart sounds: Normal heart sounds, S1 normal and S2 normal. Heart sounds not distant. No midsystolic click and no opening snap. No murmur heard.    No friction rub.  Pulmonary:     Effort: Pulmonary effort is normal. No respiratory distress.     Breath sounds: Normal breath sounds. No decreased breath sounds, wheezing, rhonchi or rales.  Musculoskeletal:     Cervical back: Normal range of motion.     Right lower leg: No edema.     Left lower leg: No edema.  Skin:    General: Skin is warm and dry.      Nails: There is no clubbing.  Neurological:     Mental Status: He is alert and oriented to person, place, and time.  Psychiatric:        Speech: Speech normal.        Behavior: Behavior normal.        Thought Content: Thought content normal.        Judgment: Judgment normal.     Wt Readings from Last 3 Encounters:  08/10/24 177 lb (80.3 kg)  05/02/23 177 lb 3.2 oz (80.4 kg)  04/08/22 177 lb 8 oz (80.5 kg)     ASSESSMENT & PLAN:   Persistent A-fib: Maintaining sinus rhythm with mildly bradycardic rate (asymptomatic) on a low-dose of bisoprolol  5 mg daily.  Has previously demonstrated appropriate chronotropic competence.  CHA2DS2-VASc 2 (HTN, age x 1).  No longer on OAC.  Should he have documented recurrence of A-fib, this would need to be revisited.  HTN: Blood pressure mildly elevated in the office today though well-controlled at home.  He remains on amlodipine  10 mg, benazepril  40 mg, and bisoprolol /HCTZ 5/6.25 mg daily with KCl repletion 20 mEq 3 times daily.  Check renal function and electrolytes.  Maintained potassium at goal 4.0.  HLD with statin intolerance: LDL 116 in 01/2023.  He has noted myalgias and arthralgias on low-dose rosuvastatin  3 days/week prompting him to reduce dose to 2 days/week with some improvement, though not resolution of myalgias and arthralgias.  He is fasting today.  We will update a lipid panel.  If LDL remains above goal would recommend transitioning from statin to bempedoic acid.     Disposition: F/u with Dr. Darron or an APP in 12 months.   Medication Adjustments/Labs and Tests Ordered: Current medicines are reviewed at length with the patient today.  Concerns regarding  medicines are outlined above. Medication changes, Labs and Tests ordered today are summarized above and listed in the Patient Instructions accessible in Encounters.   Signed, Bernardino Bring, PA-C 08/10/2024 12:54 PM     [facility] [place] [address] [place], [place] [address] [phone]

## 2024-08-10 ENCOUNTER — Encounter: Payer: Self-pay | Admitting: Physician Assistant

## 2024-08-10 ENCOUNTER — Ambulatory Visit: Attending: Physician Assistant | Admitting: Physician Assistant

## 2024-08-10 VITALS — BP 148/90 | HR 56 | Ht 68.0 in | Wt 177.0 lb

## 2024-08-10 DIAGNOSIS — Z79899 Other long term (current) drug therapy: Secondary | ICD-10-CM | POA: Diagnosis not present

## 2024-08-10 DIAGNOSIS — I4819 Other persistent atrial fibrillation: Secondary | ICD-10-CM | POA: Diagnosis not present

## 2024-08-10 DIAGNOSIS — E785 Hyperlipidemia, unspecified: Secondary | ICD-10-CM | POA: Diagnosis not present

## 2024-08-10 DIAGNOSIS — Z789 Other specified health status: Secondary | ICD-10-CM

## 2024-08-10 DIAGNOSIS — I10 Essential (primary) hypertension: Secondary | ICD-10-CM | POA: Diagnosis not present

## 2024-08-10 NOTE — Patient Instructions (Signed)
 Medication Instructions:  Your physician recommends that you continue on your current medications as directed. Please refer to the Current Medication list given to you today.   *If you need a refill on your cardiac medications before your next appointment, please call your pharmacy*  Lab Work: Your provider would like for you to have following labs drawn today CMeT, CBC, Lipid panel. .   If you have labs (blood work) drawn today and your tests are completely normal, you will receive your results only by: MyChart Message (if you have MyChart) OR A paper copy in the mail If you have any lab test that is abnormal or we need to change your treatment, we will call you to review the results.  Testing/Procedures: None ordered at this time   Follow-Up: At [facility], you and your health needs are our priority.  As part of our continuing mission to provide you with exceptional heart care, our providers are all part of one team.  This team includes your primary Cardiologist (physician) and Advanced Practice Providers or APPs (Physician Assistants and Nurse Practitioners) who all work together to provide you with the care you need, when you need it.  Your next appointment:   1 year(s)  Provider:   You may see Deatrice Cage, MD or Bernardino Bring, PA-C  We recommend signing up for the patient portal called MyChart.  Sign up information is provided on this After Visit Summary.  MyChart is used to connect with patients for Virtual Visits (Telemedicine).  Patients are able to view lab/test results, encounter notes, upcoming appointments, etc.  Non-urgent messages can be sent to your provider as well.   To learn more about what you can do with MyChart, go to [email or web address].

## 2024-08-11 LAB — COMPREHENSIVE METABOLIC PANEL WITH GFR
ALT: 20 IU/L (ref 0–44)
AST: 25 IU/L (ref 0–40)
Albumin: 4.6 g/dL (ref 3.9–4.9)
Alkaline Phosphatase: 89 IU/L (ref 47–123)
BUN/Creatinine Ratio: 9 — ABNORMAL LOW (ref 10–24)
BUN: 9 mg/dL (ref 8–27)
Bilirubin Total: 1.1 mg/dL (ref 0.0–1.2)
CO2: 27 mmol/L (ref 20–29)
Calcium: 9.5 mg/dL (ref 8.6–10.2)
Chloride: 102 mmol/L (ref 96–106)
Creatinine, Ser: 0.99 mg/dL (ref 0.76–1.27)
Globulin, Total: 2.4 g/dL (ref 1.5–4.5)
Glucose: 97 mg/dL (ref 70–99)
Potassium: 3.9 mmol/L (ref 3.5–5.2)
Sodium: 145 mmol/L — ABNORMAL HIGH (ref 134–144)
Total Protein: 7 g/dL (ref 6.0–8.5)
eGFR: 83 mL/min/1.73 (ref >59)

## 2024-08-11 LAB — CBC
Hematocrit: 48.4 % (ref 37.5–51.0)
Hemoglobin: 16.2 g/dL (ref 13.0–17.7)
MCH: 31.6 pg (ref 26.6–33.0)
MCHC: 33.5 g/dL (ref 31.5–35.7)
MCV: 95 fL (ref 79–97)
Platelets: 250 x10E3/uL (ref 150–450)
RBC: 5.12 x10E6/uL (ref 4.14–5.80)
RDW: 11.8 % (ref 11.6–15.4)
WBC: 6.6 x10E3/uL (ref 3.4–10.8)

## 2024-08-11 LAB — LIPID PANEL
Chol/HDL Ratio: 2.7 ratio (ref 0.0–5.0)
Cholesterol, Total: 171 mg/dL (ref 100–199)
HDL: 63 mg/dL (ref >39)
LDL Chol Calc (NIH): 93 mg/dL (ref 0–99)
Triglycerides: 82 mg/dL (ref 0–149)
VLDL Cholesterol Cal: 15 mg/dL (ref 5–40)

## 2024-08-12 ENCOUNTER — Ambulatory Visit: Payer: Self-pay | Admitting: Physician Assistant

## 2024-08-12 DIAGNOSIS — Z79899 Other long term (current) drug therapy: Secondary | ICD-10-CM

## 2024-08-12 DIAGNOSIS — E785 Hyperlipidemia, unspecified: Secondary | ICD-10-CM

## 2024-08-12 MED ORDER — NEXLETOL 180 MG PO TABS: 180.0000 mg | ORAL_TABLET | Freq: Every day | ORAL | 11 refills | Status: AC

## 2024-08-13 ENCOUNTER — Telehealth: Payer: Self-pay | Admitting: Pharmacy Technician

## 2024-08-13 NOTE — Telephone Encounter (Signed)
   Pharmacy Patient Advocate Encounter   Received notification from CoverMyMeds that prior authorization for nexletol is required/requested.   Insurance verification completed.   The patient is insured through [employer].   Per test claim: PA required; PA submitted to above mentioned insurance via Latent Key/confirmation #/EOC [id number] Status is pending

## 2024-08-13 NOTE — Telephone Encounter (Signed)
 Pharmacy Patient Advocate Encounter  Received notification from [employer] that Prior Authorization for nexletol has been APPROVED from 10/29/23 to 10/27/25   PA #/Case ID/Reference #: [id number]
